# Patient Record
Sex: Female | Born: 1980 | Race: White | Hispanic: No | Marital: Married | Smoking: Never smoker
Health system: Southern US, Community
[De-identification: ages and names within clinical notes are randomized; demographics above are authoritative.]

## PROBLEM LIST (undated history)

## (undated) DIAGNOSIS — G43909 Migraine, unspecified, not intractable, without status migrainosus: Secondary | ICD-10-CM

## (undated) HISTORY — DX: Migraine, unspecified, not intractable, without status migrainosus: G43.909

## (undated) HISTORY — PX: WISDOM TOOTH EXTRACTION: SHX21

## (undated) HISTORY — PX: AUGMENTATION MAMMAPLASTY: SUR837

---

## 2009-06-14 LAB — ABO/RH: RH Type: POSITIVE

## 2011-02-04 ENCOUNTER — Ambulatory Visit (INDEPENDENT_AMBULATORY_CARE_PROVIDER_SITE_OTHER): Payer: BC Managed Care – PPO | Admitting: Obstetrics & Gynecology

## 2011-02-04 ENCOUNTER — Encounter: Payer: Self-pay | Admitting: Obstetrics & Gynecology

## 2011-02-04 VITALS — BP 111/68 | HR 71 | Temp 98.5°F | Resp 16 | Ht 66.0 in | Wt 172.0 lb

## 2011-02-04 DIAGNOSIS — Z30430 Encounter for insertion of intrauterine contraceptive device: Secondary | ICD-10-CM

## 2011-02-04 MED ORDER — LEVONORGESTREL 20 MCG/24HR IU IUD
INTRAUTERINE_SYSTEM | Freq: Once | INTRAUTERINE | Status: AC
  Administered 2011-02-04: 16:00:00 via INTRAUTERINE

## 2011-02-04 NOTE — Progress Notes (Signed)
  Subjective:    Patient ID: Carol Montoya, female    DOB: Oct 06, 1980, 30 y.o.   MRN: [id number]  HPI She has 3 children and wants a Mirena.  She is tired of taking the pill.    Review of Systems    pap normal and UTD, Flu shot already given. Objective:   Physical Exam  IUD Insertion Procedure Note  Pre-operative Diagnosis: desires LARC  Post-operative Diagnosis: same  Indications: contraception  Procedure Details  Urine pregnancy test was done and result was negative.  The risks (including infection, bleeding, pain, and uterine perforation) and benefits of the procedure were explained to the patient and Verbal informed consent was obtained.    Cervix cleansed with Betadine. Uterus sounded to 8 cm. IUD inserted without difficulty. String visible and trimmed . Patient tolerated procedure well.  IUD Information: Mirena.  Condition: Stable  Complications: None  Plan:  The patient was advised to call for any fever or for prolonged or severe pain or bleeding. She was advised to use NSAID as needed for mild to moderate pain.   Attending Physician Documentation: I was present for or participated in the entire procedure, including opening and closing.      Assessment & Plan:

## 2011-02-04 NOTE — Progress Notes (Signed)
Addended by: Granville Lewis on: 02/04/2011 04:29 PM   Modules accepted: Orders

## 2011-03-10 ENCOUNTER — Encounter: Payer: Self-pay | Admitting: *Deleted

## 2011-05-05 ENCOUNTER — Ambulatory Visit: Payer: BC Managed Care – PPO

## 2011-10-13 LAB — CBC AND DIFFERENTIAL
HCT: 42 % (ref 36–46)
Hemoglobin: 14.4 g/dL (ref 12.0–16.0)
Platelets: 230 10*3/uL (ref 150–399)
WBC: 7.7 10^3/mL

## 2011-10-13 LAB — BASIC METABOLIC PANEL
BUN: 17 mg/dL (ref 4–21)
Creatinine: 0.9 mg/dL (ref 0.5–1.1)
Glucose: 86 mg/dL
Potassium: 3.8 mmol/L (ref 3.4–5.3)
Sodium: 137 mmol/L (ref 137–147)

## 2011-10-13 LAB — HEPATIC FUNCTION PANEL
ALT: 8 U/L (ref 7–35)
AST: 10 U/L — AB (ref 13–35)
Bilirubin, Total: 0.5 mg/dL

## 2012-02-12 ENCOUNTER — Ambulatory Visit: Payer: BC Managed Care – PPO | Admitting: Obstetrics & Gynecology

## 2012-02-12 ENCOUNTER — Ambulatory Visit (INDEPENDENT_AMBULATORY_CARE_PROVIDER_SITE_OTHER): Payer: BC Managed Care – PPO | Admitting: Obstetrics & Gynecology

## 2012-02-12 ENCOUNTER — Encounter: Payer: Self-pay | Admitting: Obstetrics & Gynecology

## 2012-02-12 VITALS — BP 103/72 | HR 84 | Temp 97.2°F | Resp 16 | Ht 66.0 in | Wt 135.0 lb

## 2012-02-12 DIAGNOSIS — Z0000 Encounter for general adult medical examination without abnormal findings: Secondary | ICD-10-CM

## 2012-02-12 DIAGNOSIS — Z23 Encounter for immunization: Secondary | ICD-10-CM

## 2012-02-12 DIAGNOSIS — Z01419 Encounter for gynecological examination (general) (routine) without abnormal findings: Secondary | ICD-10-CM

## 2012-02-12 DIAGNOSIS — Z1151 Encounter for screening for human papillomavirus (HPV): Secondary | ICD-10-CM

## 2012-02-12 DIAGNOSIS — Z124 Encounter for screening for malignant neoplasm of cervix: Secondary | ICD-10-CM

## 2012-02-12 NOTE — Progress Notes (Signed)
Subjective:    Carol Montoya is a 31 y.o. female who presents for an annual exam. The patient has no complaints today. The patient is sexually active. GYN screening history: last pap: was normal. The patient wears seatbelts: yes. The patient participates in regular exercise: yes. Has the patient ever been transfused or tattooed?: no. The patient reports that there is not domestic violence in her life.   Menstrual History: OB History    Grav Para Term Preterm Abortions TAB SAB Ect Mult Living   3 3 3       3       Menarche age: 71 No LMP recorded. Patient is not currently having periods (Reason: IUD).    The following portions of the patient's history were reviewed and updated as appropriate: allergies, current medications, past family history, past medical history, past social history, past surgical history and problem list.  Review of Systems A comprehensive review of systems was negative. She has been with her husband since 40 yo.   Objective:    BP 103/72  Pulse 84  Temp 97.2 F (36.2 C) (Oral)  Resp 16  Ht 5\' 6"  (1.676 m)  Wt 135 lb (61.236 kg)  BMI 21.79 kg/m2  Breastfeeding? No  General Appearance:    Alert, cooperative, no distress, appears stated age  Head:    Normocephalic, without obvious abnormality, atraumatic  Eyes:    PERRL, conjunctiva/corneas clear, EOM's intact, fundi    benign, both eyes  Ears:    Normal TM's and external ear canals, both ears  Nose:   Nares normal, septum midline, mucosa normal, no drainage    or sinus tenderness  Throat:   Lips, mucosa, and tongue normal; teeth and gums normal  Neck:   Supple, symmetrical, trachea midline, no adenopathy;    thyroid:  no enlargement/tenderness/nodules; no carotid   bruit or JVD  Back:     Symmetric, no curvature, ROM normal, no CVA tenderness  Lungs:     Clear to auscultation bilaterally, respirations unlabored  Chest Wall:    No tenderness or deformity   Heart:    Regular rate and rhythm, S1 and S2  normal, no murmur, rub   or gallop  Breast Exam:    No tenderness, masses, or nipple abnormality  Abdomen:     Soft, non-tender, bowel sounds active all four quadrants,    no masses, no organomegaly  Genitalia:    Normal female without lesion, discharge or tenderness, IUD strings seen, NSSA, NT, normal adnexa     Extremities:   Extremities normal, atraumatic, no cyanosis or edema  Pulses:   2+ and symmetric all extremities  Skin:   Skin color, texture, turgor normal, no rashes or lesions  Lymph nodes:   Cervical, supraclavicular, and axillary nodes normal  Neurologic:   CNII-XII intact, normal strength, sensation and reflexes    throughout  .    Assessment:    Healthy female exam.    Plan:     Pap smear.

## 2013-03-09 ENCOUNTER — Ambulatory Visit: Payer: BC Managed Care – PPO | Admitting: Obstetrics & Gynecology

## 2013-03-16 ENCOUNTER — Encounter: Payer: Self-pay | Admitting: Obstetrics & Gynecology

## 2013-03-16 ENCOUNTER — Ambulatory Visit (INDEPENDENT_AMBULATORY_CARE_PROVIDER_SITE_OTHER): Payer: BC Managed Care – PPO | Admitting: Obstetrics & Gynecology

## 2013-03-16 VITALS — BP 102/64 | HR 69 | Resp 16 | Ht 65.0 in | Wt 147.0 lb

## 2013-03-16 DIAGNOSIS — Z23 Encounter for immunization: Secondary | ICD-10-CM

## 2013-03-16 DIAGNOSIS — Z803 Family history of malignant neoplasm of breast: Secondary | ICD-10-CM

## 2013-03-16 DIAGNOSIS — Z01419 Encounter for gynecological examination (general) (routine) without abnormal findings: Secondary | ICD-10-CM

## 2013-03-16 NOTE — Progress Notes (Signed)
  Subjective:     Carol Montoya is a 32 y.o. female here for a routine exam.  Current complaints: would like a primary care provider.  Happy with Mirnea---rare spotting.  Personal health questionnaire reviewed: yes.   Gynecologic History No LMP recorded. Patient is not currently having periods (Reason: IUD). Contraception: IUD Last Pap: 2013. Results were: normal with Neg co testing Last mammogram: n/a. Results were: n/a  Obstetric History OB History  Gravida Para Term Preterm AB SAB TAB Ectopic Multiple Living  3 3 3       3     # Outcome Date GA Lbr Len/2nd Weight Sex Delivery Anes PTL Lv  3 TRM 01/10/10    M SVD     2 TRM 02/19/06    M SVD     1 TRM 11/25/01    M SVD          The following portions of the patient's history were reviewed and updated as appropriate: allergies, current medications, past family history, past medical history, past social history, past surgical history and problem list.  Review of Systems Pertinent items are noted in HPI.    Objective:      Filed Vitals:   03/16/13 0848  BP: 102/64  Pulse: 69  Resp: 16  Height: 5\' 5"  (1.651 m)  Weight: 147 lb (66.679 kg)   Vitals:  WNL General appearance: alert, cooperative and no distress Head: Normocephalic, without obvious abnormality, atraumatic Eyes: negative Throat: lips, mucosa, and tongue normal; teeth and gums normal Lungs: clear to auscultation bilaterally Breasts: normal appearance, no masses or tenderness, No nipple retraction or dimpling, No nipple discharge or bleeding Heart: regular rate and rhythm Abdomen: soft, non-tender; bowel sounds normal; no masses,  no organomegaly Pelvic: cervix normal in appearance, external genitalia normal, no adnexal masses or tenderness, no bladder tenderness, no cervical motion tenderness, perianal skin: no external genital warts noted, IUD strings felt,2 urethra without abnormality or discharge, uterus normal size, shape, and consistency and vagina normal  without discharge Extremities: no edema, redness or tenderness in the calves or thighs Skin: no lesions or rash Lymph nodes: Axillary adenopathy: none         Assessment:    Healthy female exam.    Plan:    Education reviewed: self breast exams and skin cancer screening. Contraception: IUD. Follow up in: 1 year. Flu shot Referral to FP to establish primary care.

## 2013-03-16 NOTE — Addendum Note (Signed)
Addended by: Arne Cleveland on: 03/16/2013 09:28 AM   Modules accepted: Orders, SmartSet

## 2013-07-04 ENCOUNTER — Ambulatory Visit: Payer: BC Managed Care – PPO | Admitting: Physician Assistant

## 2013-07-11 ENCOUNTER — Encounter (INDEPENDENT_AMBULATORY_CARE_PROVIDER_SITE_OTHER): Payer: Self-pay

## 2013-07-11 ENCOUNTER — Encounter: Payer: Self-pay | Admitting: Physician Assistant

## 2013-07-11 ENCOUNTER — Ambulatory Visit (INDEPENDENT_AMBULATORY_CARE_PROVIDER_SITE_OTHER): Payer: BC Managed Care – PPO | Admitting: Physician Assistant

## 2013-07-11 VITALS — BP 95/54 | HR 73 | Ht 66.0 in | Wt 148.0 lb

## 2013-07-11 DIAGNOSIS — F3289 Other specified depressive episodes: Secondary | ICD-10-CM

## 2013-07-11 DIAGNOSIS — R5381 Other malaise: Secondary | ICD-10-CM

## 2013-07-11 DIAGNOSIS — F32A Depression, unspecified: Secondary | ICD-10-CM

## 2013-07-11 DIAGNOSIS — F329 Major depressive disorder, single episode, unspecified: Secondary | ICD-10-CM

## 2013-07-11 DIAGNOSIS — R5383 Other fatigue: Secondary | ICD-10-CM

## 2013-07-11 DIAGNOSIS — R6889 Other general symptoms and signs: Secondary | ICD-10-CM

## 2013-07-11 DIAGNOSIS — Z23 Encounter for immunization: Secondary | ICD-10-CM

## 2013-07-11 DIAGNOSIS — Z0000 Encounter for general adult medical examination without abnormal findings: Secondary | ICD-10-CM

## 2013-07-11 DIAGNOSIS — Z131 Encounter for screening for diabetes mellitus: Secondary | ICD-10-CM

## 2013-07-11 DIAGNOSIS — Z13220 Encounter for screening for lipoid disorders: Secondary | ICD-10-CM

## 2013-07-11 LAB — CBC
HEMATOCRIT: 38.9 % (ref 36.0–46.0)
HEMOGLOBIN: 13.3 g/dL (ref 12.0–15.0)
MCH: 31.7 pg (ref 26.0–34.0)
MCHC: 34.2 g/dL (ref 30.0–36.0)
MCV: 92.8 fL (ref 78.0–100.0)
Platelets: 272 10*3/uL (ref 150–400)
RBC: 4.19 MIL/uL (ref 3.87–5.11)
RDW: 13.7 % (ref 11.5–15.5)
WBC: 6.7 10*3/uL (ref 4.0–10.5)

## 2013-07-11 NOTE — Patient Instructions (Signed)
Please call so we can fax lab to psychiatrist.

## 2013-07-11 NOTE — Progress Notes (Signed)
Subjective:    Patient ID: Carol Montoya, female    DOB: 1980-09-28, 33 y.o.   MRN: [id number]  HPI Pt is a 33 yo female who presents to the clinic to establish care and get screening labs. She does admit to being cold all the time. Denies any hair or skin changes. She is also always tired. She has hx of depression but controlled with topamax. Her psychiatrist tried everything else before and this is the only thing that worked. She denies any depression today. She exercises regularly at least 3-5 times a week.   . Active Ambulatory Problems    Diagnosis Date Noted  . Family history of breast cancer 03/16/2013  . Depression 07/11/2013   Resolved Ambulatory Problems    Diagnosis Date Noted  . No Resolved Ambulatory Problems   Past Medical History  Diagnosis Date  . Migraines    . History   Social History  . Marital Status: Single    Spouse Name: N/A    Number of Children: N/A  . Years of Education: N/A   Occupational History  . Insurance agent    Social History Main Topics  . Smoking status: Never Smoker   . Smokeless tobacco: Never Used  . Alcohol Use: Yes     Comment: once monthly  . Drug Use: No  . Sexual Activity: Yes    Partners: Male   Other Topics Concern  . Not on file   Social History Narrative  . No narrative on file   . Family History  Problem Relation Age of Onset  . Depression Mother   . Cancer Maternal Aunt      Review of Systems  All other systems reviewed and are negative.       Objective:   Physical Exam  Constitutional: She is oriented to person, place, and time. She appears well-developed and well-nourished.  HENT:  Head: Normocephalic and atraumatic.  Right Ear: External ear normal.  Left Ear: External ear normal.  Nose: Nose normal.  Mouth/Throat: Oropharynx is clear and moist. No oropharyngeal exudate.  Right ear impacted with cerumen.  TM of left clear.   Eyes: Conjunctivae and EOM are normal. Pupils are equal, round, and  reactive to light. Right eye exhibits no discharge. Left eye exhibits no discharge.  Neck: Normal range of motion. Neck supple. No JVD present. No tracheal deviation present. No thyromegaly present.  Cardiovascular: Normal rate, regular rhythm and normal heart sounds.   Pulmonary/Chest: Effort normal and breath sounds normal. She has no wheezes.  Abdominal: Soft. Bowel sounds are normal. She exhibits no distension and no mass. There is no tenderness. There is no rebound and no guarding.  Musculoskeletal: Normal range of motion.  Lymphadenopathy:    She has no cervical adenopathy.  Neurological: She is alert and oriented to person, place, and time. She has normal reflexes. No cranial nerve deficit. Coordination normal.  Skin: Skin is dry.  Psychiatric: She has a normal mood and affect. Her behavior is normal.          Assessment & Plan:  Depression- managed by Jerene Dilling psychiatrist. PHQ-9 was 1.   Fatigue/cold intolerance- will get faitgue work up. Continue with exercise.   Right ear cerumen impaction- forgot to get nurse to irrigate. Will call pt and encourage debrox if not able to clean out come in for irrigation.    Routine CPE-  Will get screening labs.  Tdap was given without complication.  Pap smear 2014, normal and  up to date.  Continue to exercise.  Weight looks great.  recommended calcium 1200mg  and vitamin D 800IU. Follow up as needed.

## 2013-07-12 LAB — VITAMIN D 25 HYDROXY (VIT D DEFICIENCY, FRACTURES): Vit D, 25-Hydroxy: 33 ng/mL (ref 30–89)

## 2013-07-12 LAB — COMPLETE METABOLIC PANEL WITH GFR
ALT: 10 U/L (ref 0–35)
AST: 12 U/L (ref 0–37)
Albumin: 4.6 g/dL (ref 3.5–5.2)
Alkaline Phosphatase: 45 U/L (ref 39–117)
BUN: 16 mg/dL (ref 6–23)
CO2: 21 mEq/L (ref 19–32)
Calcium: 9 mg/dL (ref 8.4–10.5)
Chloride: 109 mEq/L (ref 96–112)
Creat: 0.88 mg/dL (ref 0.50–1.10)
GFR, Est African American: >89 mL/min
GFR, Est Non African American: 85 mL/min
Glucose, Bld: 89 mg/dL (ref 70–99)
Potassium: 4.3 mEq/L (ref 3.5–5.3)
Sodium: 140 mEq/L (ref 135–145)
Total Bilirubin: 0.4 mg/dL (ref 0.2–1.2)
Total Protein: 6.9 g/dL (ref 6.0–8.3)

## 2013-07-12 LAB — LIPID PANEL
CHOLESTEROL: 117 mg/dL (ref 0–200)
HDL: 45 mg/dL (ref >39)
LDL Cholesterol: 60 mg/dL (ref 0–99)
TRIGLYCERIDES: 58 mg/dL (ref <150)
Total CHOL/HDL Ratio: 2.6 Ratio
VLDL: 12 mg/dL (ref 0–40)

## 2013-07-12 LAB — FERRITIN: FERRITIN: 54 ng/mL (ref 10–291)

## 2013-07-12 LAB — T4, FREE: Free T4: 0.8 ng/dL (ref 0.80–1.80)

## 2013-07-12 LAB — VITAMIN B12: Vitamin B-12: 300 pg/mL (ref 211–911)

## 2013-07-12 LAB — TSH: TSH: 1.837 u[IU]/mL (ref 0.350–4.500)

## 2013-07-12 LAB — T3, FREE: T3, Free: 3.2 pg/mL (ref 2.3–4.2)

## 2013-12-21 ENCOUNTER — Encounter: Payer: Self-pay | Admitting: Physician Assistant

## 2014-02-20 ENCOUNTER — Encounter: Payer: Self-pay | Admitting: Physician Assistant

## 2014-03-22 ENCOUNTER — Encounter: Payer: Self-pay | Admitting: Obstetrics & Gynecology

## 2014-03-22 ENCOUNTER — Ambulatory Visit (INDEPENDENT_AMBULATORY_CARE_PROVIDER_SITE_OTHER): Payer: BC Managed Care – PPO | Admitting: Obstetrics & Gynecology

## 2014-03-22 VITALS — BP 101/61 | HR 63 | Resp 16 | Ht 66.0 in | Wt 144.0 lb

## 2014-03-22 DIAGNOSIS — Z124 Encounter for screening for malignant neoplasm of cervix: Secondary | ICD-10-CM

## 2014-03-22 DIAGNOSIS — Z23 Encounter for immunization: Secondary | ICD-10-CM

## 2014-03-22 DIAGNOSIS — Z113 Encounter for screening for infections with a predominantly sexual mode of transmission: Secondary | ICD-10-CM | POA: Diagnosis not present

## 2014-03-22 DIAGNOSIS — Z0000 Encounter for general adult medical examination without abnormal findings: Secondary | ICD-10-CM

## 2014-03-22 DIAGNOSIS — Z1151 Encounter for screening for human papillomavirus (HPV): Secondary | ICD-10-CM

## 2014-03-22 DIAGNOSIS — Z01419 Encounter for gynecological examination (general) (routine) without abnormal findings: Secondary | ICD-10-CM | POA: Diagnosis not present

## 2014-03-22 LAB — RPR

## 2014-03-22 MED ORDER — INFLUENZA VAC SPLIT QUAD 0.5 ML IM SUSY
0.5000 mL | PREFILLED_SYRINGE | Freq: Once | INTRAMUSCULAR | Status: AC
  Administered 2014-03-22: 0.5 mL via INTRAMUSCULAR

## 2014-03-22 NOTE — Progress Notes (Signed)
Subjective:    Carol Montoya is a 33 y.o. separated W P3 (12, 8, 514 yo sons) female who presents for an annual exam. The patient has no complaints today except always freezing and tired. She had TSH checked recently. She saw Norval GableJade Breebeck recently.The patient is sexually active. GYN screening history: last pap: was normal. The patient wears seatbelts: yes. The patient participates in regular exercise: yes. Has the patient ever been transfused or tattooed?: yes. (transfusion after first delivery) The patient reports that there is not domestic violence in her life.   Menstrual History: OB History    Gravida Para Term Preterm AB TAB SAB Ectopic Multiple Living   3 3 3       3       Menarche age: [age] No LMP recorded. Patient is not currently having periods (Reason: IUD).    The following portions of the patient's history were reviewed and updated as appropriate: allergies, current medications, past family history, past medical history, past social history, past surgical history and problem list.  Review of Systems Pertinent items are noted in HPI. 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 Monogamous with boyfriend for 1+ year. Denies dyspareunia. Happy with amenorrhea due to Mirena, due to be replaced 10/17. Wants flu vaccine.    Objective:    BP 101/61 mmHg  Pulse 63  Resp 16  Ht 5\' 6"  (1.676 m)  Wt 144 lb (65.318 kg)  BMI 23.25 kg/m2  General Appearance:    Alert, cooperative, no distress, appears stated age  Head:    Normocephalic, without obvious abnormality, atraumatic  Eyes:    PERRL, conjunctiva/corneas clear, EOM's intact, fundi    benign, both eyes  Ears:    Normal TM's and external ear canals, both ears  Nose:   Nares normal, septum midline, mucosa normal, no drainage    or sinus tenderness  Throat:   Lips, mucosa, and tongue normal; teeth and gums normal  Neck:   Supple, symmetrical, trachea midline, no adenopathy;    thyroid:  no enlargement/tenderness/nodules; no  carotid   bruit or JVD  Back:     Symmetric, no curvature, ROM normal, no CVA tenderness  Lungs:     Clear to auscultation bilaterally, respirations unlabored  Chest Wall:    No tenderness or deformity   Heart:    Regular rate and rhythm, S1 and S2 normal, no murmur, rub   or gallop  Breast Exam:    No tenderness, masses, or nipple abnormality  Abdomen:     Soft, non-tender, bowel sounds active all four quadrants,    no masses, no organomegaly  Genitalia:    Normal female without lesion, discharge or tenderness     Extremities:   Extremities normal, atraumatic, no cyanosis or edema  Pulses:   2+ and symmetric all extremities  Skin:   Skin color, texture, turgor normal, no rashes or lesions  Lymph nodes:   Cervical, supraclavicular, and axillary nodes normal  Neurologic:   CNII-XII intact, normal strength, sensation and reflexes    throughout  .    Assessment:    Healthy female exam.    Plan:     Breast self exam technique reviewed and patient encouraged to perform self-exam monthly. Thin prep Pap smear. with cotesting, gc, ct testing STI testing

## 2014-03-23 LAB — HEPATITIS C ANTIBODY: HCV Ab: NEGATIVE

## 2014-03-23 LAB — HIV ANTIBODY (ROUTINE TESTING W REFLEX): HIV 1&2 Ab, 4th Generation: NONREACTIVE

## 2014-03-23 LAB — HEPATITIS B SURFACE ANTIGEN: Hepatitis B Surface Ag: NEGATIVE

## 2014-03-27 LAB — CYTOLOGY - PAP

## 2014-08-07 ENCOUNTER — Telehealth: Payer: Self-pay

## 2014-08-07 ENCOUNTER — Encounter: Payer: Self-pay | Admitting: Physician Assistant

## 2014-08-07 ENCOUNTER — Other Ambulatory Visit: Payer: Self-pay | Admitting: Physician Assistant

## 2014-08-07 ENCOUNTER — Ambulatory Visit (INDEPENDENT_AMBULATORY_CARE_PROVIDER_SITE_OTHER): Payer: BLUE CROSS/BLUE SHIELD | Admitting: Physician Assistant

## 2014-08-07 VITALS — BP 103/65 | HR 71 | Ht 66.0 in | Wt 147.0 lb

## 2014-08-07 DIAGNOSIS — F411 Generalized anxiety disorder: Secondary | ICD-10-CM | POA: Insufficient documentation

## 2014-08-07 DIAGNOSIS — Z79899 Other long term (current) drug therapy: Secondary | ICD-10-CM | POA: Diagnosis not present

## 2014-08-07 DIAGNOSIS — F329 Major depressive disorder, single episode, unspecified: Secondary | ICD-10-CM

## 2014-08-07 DIAGNOSIS — Z131 Encounter for screening for diabetes mellitus: Secondary | ICD-10-CM | POA: Diagnosis not present

## 2014-08-07 DIAGNOSIS — R5383 Other fatigue: Secondary | ICD-10-CM

## 2014-08-07 DIAGNOSIS — F32A Depression, unspecified: Secondary | ICD-10-CM

## 2014-08-07 LAB — CBC WITH DIFFERENTIAL/PLATELET
Basophils Absolute: 0.1 10*3/uL (ref 0.0–0.1)
Basophils Relative: 1 % (ref 0–1)
Eosinophils Absolute: 0.2 10*3/uL (ref 0.0–0.7)
Eosinophils Relative: 3 % (ref 0–5)
HCT: 41.1 % (ref 36.0–46.0)
Hemoglobin: 13.5 g/dL (ref 12.0–15.0)
Lymphocytes Relative: 21 % (ref 12–46)
Lymphs Abs: 1.2 10*3/uL (ref 0.7–4.0)
MCH: 30.6 pg (ref 26.0–34.0)
MCHC: 32.8 g/dL (ref 30.0–36.0)
MCV: 93.2 fL (ref 78.0–100.0)
MPV: 9.9 fL (ref 8.6–12.4)
Monocytes Absolute: 0.3 10*3/uL (ref 0.1–1.0)
Monocytes Relative: 6 % (ref 3–12)
Neutro Abs: 3.9 10*3/uL (ref 1.7–7.7)
Neutrophils Relative %: 69 % (ref 43–77)
Platelets: 267 10*3/uL (ref 150–400)
RBC: 4.41 MIL/uL (ref 3.87–5.11)
RDW: 13.4 % (ref 11.5–15.5)
WBC: 5.6 10*3/uL (ref 4.0–10.5)

## 2014-08-07 LAB — COMPLETE METABOLIC PANEL WITH GFR
ALT: 9 U/L (ref 0–35)
AST: 12 U/L (ref 0–37)
Albumin: 4.3 g/dL (ref 3.5–5.2)
Alkaline Phosphatase: 47 U/L (ref 39–117)
BUN: 22 mg/dL (ref 6–23)
CALCIUM: 9 mg/dL (ref 8.4–10.5)
CHLORIDE: 108 meq/L (ref 96–112)
CO2: 24 mEq/L (ref 19–32)
CREATININE: 0.78 mg/dL (ref 0.50–1.10)
GLUCOSE: 78 mg/dL (ref 70–99)
Potassium: 4.1 mEq/L (ref 3.5–5.3)
Sodium: 141 mEq/L (ref 135–145)
Total Bilirubin: 0.6 mg/dL (ref 0.2–1.2)
Total Protein: 6.9 g/dL (ref 6.0–8.3)

## 2014-08-07 MED ORDER — EPINEPHRINE 0.3 MG/0.3ML IJ SOAJ: 0.3000 mg | Freq: Once | INTRAMUSCULAR | Status: DC

## 2014-08-07 MED ORDER — BUSPIRONE HCL 5 MG PO TABS: 5.0000 mg | ORAL_TABLET | Freq: Three times a day (TID) | ORAL | Status: DC

## 2014-08-07 NOTE — Telephone Encounter (Signed)
Patient called stated that she forgot to request a rx for epi pens to have on hand for her shell fish allergy. Patient request it be sent to her pharmacy. Artelia Game,CMA

## 2014-08-07 NOTE — Patient Instructions (Signed)

## 2014-08-07 NOTE — Progress Notes (Signed)
   Subjective:    Patient ID: Carol Montoya, female    DOB: 08/14/1980, 34 y.o.   MRN: [id number]  HPI  Pt is a 34 yo female who has a ongoing battle with fatigue. She just feels tired all the time. She has had labs drawn in the past and wanted more drawn today. She admits to some anxiety and depression but medication never seems to help. She exercises 3 times a week. treied wellbutrin, lexapro, celexa and seroquel to help her sleep better. Pt feels like she sleeps fairly well not. No snoring or frequent waking.  Nothing seems to make better. On vitamin D.   Review of Systems  All other systems reviewed and are negative.      Objective:   Physical Exam  Constitutional: She is oriented to person, place, and time. She appears well-developed and well-nourished.  HENT:  Head: Normocephalic and atraumatic.  Neck: Normal range of motion. Neck supple. No thyromegaly present.  Cardiovascular: Normal rate, regular rhythm and normal heart sounds.   Pulmonary/Chest: Effort normal and breath sounds normal.  Lymphadenopathy:    She has no cervical adenopathy.  Neurological: She is alert and oriented to person, place, and time.  Psychiatric: She has a normal mood and affect. Her behavior is normal.          Assessment & Plan:  Anxiety/depression- PHQ-9 was 4. GAD-7 was 10. Discussed medication options declined everything except buspar. buspar up to 3 times a day given.   fagitue- will run labs again. Given fatigue HO. Pt is not obese. Sleeping fairly well. At this point sounds like pt has some chronic fatigue syndrome.

## 2014-08-07 NOTE — Telephone Encounter (Signed)
Sent!

## 2014-08-08 LAB — FERRITIN: Ferritin: 48 ng/mL (ref 10–291)

## 2014-08-08 LAB — VITAMIN D 25 HYDROXY (VIT D DEFICIENCY, FRACTURES): VIT D 25 HYDROXY: 25 ng/mL — AB (ref 30–100)

## 2014-08-08 LAB — VITAMIN B12: Vitamin B-12: 407 pg/mL (ref 211–911)

## 2014-08-08 LAB — TSH: TSH: 2.416 u[IU]/mL (ref 0.350–4.500)

## 2014-09-19 ENCOUNTER — Other Ambulatory Visit: Payer: Self-pay | Admitting: Physician Assistant

## 2014-12-08 ENCOUNTER — Other Ambulatory Visit: Payer: Self-pay | Admitting: Family Medicine

## 2015-01-23 ENCOUNTER — Other Ambulatory Visit: Payer: Self-pay | Admitting: Obstetrics & Gynecology

## 2015-01-23 DIAGNOSIS — Z1231 Encounter for screening mammogram for malignant neoplasm of breast: Secondary | ICD-10-CM

## 2015-02-06 ENCOUNTER — Ambulatory Visit (INDEPENDENT_AMBULATORY_CARE_PROVIDER_SITE_OTHER): Payer: BLUE CROSS/BLUE SHIELD | Admitting: Obstetrics & Gynecology

## 2015-02-06 ENCOUNTER — Encounter: Payer: Self-pay | Admitting: Obstetrics & Gynecology

## 2015-02-06 VITALS — BP 90/53 | HR 71 | Resp 16 | Ht 65.0 in | Wt 146.0 lb

## 2015-02-06 DIAGNOSIS — Z803 Family history of malignant neoplasm of breast: Secondary | ICD-10-CM

## 2015-02-06 DIAGNOSIS — Z30431 Encounter for routine checking of intrauterine contraceptive device: Secondary | ICD-10-CM

## 2015-02-06 NOTE — Progress Notes (Signed)
   Subjective:    Patient ID: Carol Montoya, female    DOB: 24-May-1980, 34 y.o.   MRN: [id number]  HPI  Patient is a 34 year old G3 P3 female who presents for breast exam and to have her IUD strings checked. Her mother was just diagnosed with stage III breast cancer a few weeks ago. She is undergoing chemotherapy. She will then undergo.bilateral mastectomy. Patient called her insurance company and they will pay for her mammogram. Patient is up-to-date on her Pap smear. She has only occasional spotting. She has no complaints  Review of Systems  Constitutional: Negative.   Respiratory: Negative.   Cardiovascular: Negative.   Gastrointestinal: Negative.   Genitourinary: Negative.        Objective:   Physical Exam  Constitutional: She is oriented to person, place, and time. She appears well-developed and well-nourished. No distress.  HENT:  Head: Normocephalic and atraumatic.  Eyes: Conjunctivae are normal.  Pulmonary/Chest: Effort normal.  Abdominal: Soft. Bowel sounds are normal. She exhibits no distension and no mass. There is no tenderness. There is no rebound and no guarding.  Genitourinary:  External genitalia is Tanner 5 Vagina normal rugae Cervix is close nontender with IUD strings seen Uterus anteverted nontender Adnexa is negative for mass or pain  Musculoskeletal: She exhibits no edema.  Neurological: She is alert and oriented to person, place, and time.  Skin: Skin is warm and dry.  Psychiatric: She has a normal mood and affect.  Vitals reviewed.         Assessment & Plan:  34 year old female presents for breast exam and requesting BRCA testing due to her mother and grandmother having breast cancer. There is no other familial cancers other than a aunt which she does not know the primary.  1-normal breast exam today. Patient requests mammogram insurance paid 2-my risk testing today 3-Pap smear up-to-date. Due in 2 years.

## 2015-02-08 ENCOUNTER — Ambulatory Visit (INDEPENDENT_AMBULATORY_CARE_PROVIDER_SITE_OTHER): Payer: BLUE CROSS/BLUE SHIELD

## 2015-02-08 DIAGNOSIS — Z1231 Encounter for screening mammogram for malignant neoplasm of breast: Secondary | ICD-10-CM

## 2015-02-10 ENCOUNTER — Other Ambulatory Visit: Payer: Self-pay | Admitting: Family Medicine

## 2015-03-22 ENCOUNTER — Encounter: Payer: Self-pay | Admitting: *Deleted

## 2015-04-02 ENCOUNTER — Telehealth: Payer: Self-pay | Admitting: *Deleted

## 2015-04-02 NOTE — Telephone Encounter (Signed)
-----   Message from Lesly DukesKelly H Leggett, MD sent at 03/30/2015 11:44 AM EST ----- My risk is negative.  RN to call pt.

## 2015-04-02 NOTE — Telephone Encounter (Signed)
Copy of neg My Risk results mailed to pt's home address.

## 2015-04-30 ENCOUNTER — Other Ambulatory Visit: Payer: Self-pay | Admitting: Physician Assistant

## 2016-01-30 ENCOUNTER — Encounter: Payer: Self-pay | Admitting: Obstetrics & Gynecology

## 2016-01-30 ENCOUNTER — Ambulatory Visit (INDEPENDENT_AMBULATORY_CARE_PROVIDER_SITE_OTHER): Payer: Medicaid Other | Admitting: Obstetrics & Gynecology

## 2016-01-30 VITALS — BP 110/71 | HR 70 | Resp 16 | Ht 66.0 in | Wt 157.0 lb

## 2016-01-30 DIAGNOSIS — Z30433 Encounter for removal and reinsertion of intrauterine contraceptive device: Secondary | ICD-10-CM | POA: Diagnosis not present

## 2016-01-30 DIAGNOSIS — Z23 Encounter for immunization: Secondary | ICD-10-CM

## 2016-01-30 DIAGNOSIS — Z975 Presence of (intrauterine) contraceptive device: Secondary | ICD-10-CM

## 2016-01-30 DIAGNOSIS — Z0000 Encounter for general adult medical examination without abnormal findings: Secondary | ICD-10-CM

## 2016-01-30 MED ORDER — TOPIRAMATE 100 MG PO TABS: 100.0000 mg | ORAL_TABLET | Freq: Two times a day (BID) | ORAL | 6 refills | Status: DC

## 2016-01-30 NOTE — Progress Notes (Signed)
   Subjective:    Patient ID: Carol Montoya, female    DOB: 07/18/1980, 35 y.o.   MRN: [id number]  HPI  35 yo SW G3 (14, 959 and 37 yo kids) here to have her 89 yo Mirena replaced with a new one. She is amenorrheic.  Review of Systems     Objective:   Physical Exam [id number] Breathing, conversing, and ambulating normally Consent signed, Time out procedure done. Cervix prepped with betadine and grasped with a single tooth tenaculum. I had to use an IUD hook to find the Mirena strings, I then easily removed the Mirena and noted that it was intact. New Mirena was easily placed and the strings were cut to 3-4 cm. Uterus sounded to 9 cm. She tolerated the procedure well.      Assessment & Plan:  Contraception- Mirena RTC 1 year for annual Refill of topamax for migraines Flu vaccine today

## 2016-07-14 ENCOUNTER — Encounter: Payer: Self-pay | Admitting: Obstetrics & Gynecology

## 2016-07-14 ENCOUNTER — Other Ambulatory Visit (HOSPITAL_COMMUNITY)
Admission: RE | Admit: 2016-07-14 | Discharge: 2016-07-14 | Disposition: A | Discharge status: Home / Self Care | Payer: Medicaid Other | Source: Ambulatory Visit | Attending: Obstetrics & Gynecology | Admitting: Obstetrics & Gynecology

## 2016-07-14 ENCOUNTER — Ambulatory Visit (INDEPENDENT_AMBULATORY_CARE_PROVIDER_SITE_OTHER): Payer: Medicaid Other | Admitting: Obstetrics & Gynecology

## 2016-07-14 VITALS — BP 93/58 | HR 67 | Resp 16 | Ht 66.0 in | Wt 163.0 lb

## 2016-07-14 DIAGNOSIS — Z01419 Encounter for gynecological examination (general) (routine) without abnormal findings: Secondary | ICD-10-CM | POA: Insufficient documentation

## 2016-07-14 MED ORDER — VITAMIN D (ERGOCALCIFEROL) 1.25 MG (50000 UNIT) PO CAPS: 50000.0000 [IU] | ORAL_CAPSULE | ORAL | 3 refills | Status: DC

## 2016-07-14 NOTE — Progress Notes (Signed)
Subjective:    Carol Montoya is a 36 y.o. SW P3 female who presents for an annual exam. She feels like her anxiety is becoming problematic with her life. Would be interested in see someone for this. She has less libido than she would like.  The patient is sexually active. GYN screening history: last pap: was normal. The patient wears seatbelts: yes. The patient participates in regular exercise: yes. Has the patient ever been transfused or tattooed?: no. The patient reports that there is not domestic violence in her life.   Menstrual History: OB History    Gravida Para Term Preterm AB Living   _0 SAB TAB Ectopic Multiple Live Births                  Menarche age: 61 No LMP recorded. Patient is not currently having periods (Reason: IUD).    The following portions of the patient's history were reviewed and updated as appropriate: allergies, current medications, past family history, past medical history, past social history, past surgical history and problem list.  Review of Systems Pertinent items are noted in HPI.   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   Objective:    BP (!) 93/58   Pulse 67   Resp 16   Ht _1  (1.676 m)   Wt 163 lb (73.9 kg)   BMI 26.31 kg/m   General Appearance:    Alert, cooperative, no distress, appears stated age  Head:    Normocephalic, without obvious abnormality, atraumatic  Eyes:    PERRL, conjunctiva/corneas clear, EOM's intact, fundi    benign, both eyes  Ears:    Normal TM's and external ear canals, both ears  Nose:   Nares normal, septum midline, mucosa normal, no drainage    or sinus tenderness  Throat:   Lips, mucosa, and tongue normal; teeth and gums normal  Neck:   Supple, symmetrical, trachea  midline, no adenopathy;    thyroid:  no enlargement/tenderness/nodules; no carotid   bruit or JVD  Back:     Symmetric, no curvature, ROM normal, no CVA tenderness  Lungs:     Clear to auscultation bilaterally, respirations unlabored  Chest Wall:    No tenderness or deformity   Heart:    Regular rate and rhythm, S1 and S2 normal, no murmur, rub   or gallop  Breast Exam:    No tenderness, masses, or nipple abnormality  Abdomen:     Soft, non-tender, bowel sounds active all four quadrants,    no masses, no organomegaly  Genitalia:    Normal female without lesion, discharge or tenderness  Rectal:    Normal tone, normal prostate, no masses or tenderness;   guaiac negative stool  Extremities:   Extremities normal, atraumatic, no cyanosis or edema  Pulses:   2+ and symmetric all extremities  Skin:   Skin color, texture, turgor normal, no rashes or lesions  Lymph nodes:   Cervical, supraclavicular, and axillary nodes normal  Neurologic:   CNII-XII intact, normal strength, sensation and reflexes    throughout  .    Assessment:    Healthy female exam.    Plan:     Thin prep  Pap smear. with cotesting Ref to psychiatry

## 2016-07-16 LAB — CYTOLOGY - PAP
Diagnosis: NEGATIVE
HPV (WINDOPATH): NOT DETECTED

## 2017-02-24 ENCOUNTER — Other Ambulatory Visit: Payer: Self-pay | Admitting: *Deleted

## 2017-02-24 MED ORDER — TOPIRAMATE 100 MG PO TABS
100.0000 mg | ORAL_TABLET | Freq: Two times a day (BID) | ORAL | 6 refills | Status: DC
Start: 2017-02-24 — End: 2017-03-03

## 2017-02-24 NOTE — Telephone Encounter (Signed)
RF request received for Topamx 100 mg.  Per VO of Dr Marice Potterove OK to RF until her next appt.

## 2017-03-03 ENCOUNTER — Other Ambulatory Visit: Payer: Self-pay | Admitting: *Deleted

## 2017-03-03 MED ORDER — TOPIRAMATE 100 MG PO TABS: 100.0000 mg | ORAL_TABLET | Freq: Two times a day (BID) | ORAL | 6 refills | Status: DC

## 2017-03-03 NOTE — Telephone Encounter (Signed)
Pt called stating that [facility] called last week about a RF on her Topamax.  The RX was approved but sent to the wrong [facility] on [address].  It was resent this AM to [address] [address].

## 2017-03-17 DIAGNOSIS — M5126 Other intervertebral disc displacement, lumbar region: Secondary | ICD-10-CM | POA: Insufficient documentation

## 2017-06-04 DIAGNOSIS — D649 Anemia, unspecified: Secondary | ICD-10-CM | POA: Insufficient documentation

## 2018-01-06 ENCOUNTER — Encounter: Payer: Self-pay | Admitting: Obstetrics & Gynecology

## 2018-01-06 ENCOUNTER — Ambulatory Visit (INDEPENDENT_AMBULATORY_CARE_PROVIDER_SITE_OTHER): Payer: BLUE CROSS/BLUE SHIELD | Admitting: Obstetrics & Gynecology

## 2018-01-06 VITALS — BP 92/60 | HR 66 | Resp 16 | Ht 66.0 in | Wt 176.0 lb

## 2018-01-06 DIAGNOSIS — Z23 Encounter for immunization: Secondary | ICD-10-CM | POA: Diagnosis not present

## 2018-01-06 DIAGNOSIS — Z803 Family history of malignant neoplasm of breast: Secondary | ICD-10-CM

## 2018-01-06 DIAGNOSIS — Z01419 Encounter for gynecological examination (general) (routine) without abnormal findings: Secondary | ICD-10-CM | POA: Diagnosis not present

## 2018-01-06 DIAGNOSIS — Z1231 Encounter for screening mammogram for malignant neoplasm of breast: Secondary | ICD-10-CM

## 2018-01-06 NOTE — Progress Notes (Signed)
Subjective:     Carol EllisJoan K Montoya is a 37 y.o. female here for a routine exam.  Current complaints: wants to make IUD strings are present, increased frequency of migraines, wants maximum screening and prevention of breast cancer.    Gynecologic History No LMP recorded. (Menstrual status: IUD). Contraception: IUD Last Pap: 2017. Results were: normal Last mammogram: 2016. Results were: normal  Obstetric History OB History  Gravida Para Term Preterm AB Living  3 3 3     3   SAB TAB Ectopic Multiple Live Births               # Outcome Date GA Lbr Len/2nd Weight Sex Delivery Anes PTL Lv  3 Term 01/10/10    M Vag-Spont     2 Term 02/19/06    M Vag-Spont     1 Term 11/25/01    M Vag-Spont        The following portions of the patient's history were reviewed and updated as appropriate: allergies, current medications, past family history, past medical history, past social history, past surgical history and problem list.  Review of Systems Pertinent items noted in HPI and remainder of comprehensive ROS otherwise negative.    Objective:   Vitals:   01/06/18 1131  BP: 92/60  Pulse: 66  Resp: 16  Weight: 176 lb (79.8 kg)  Height: 5\' 6"  (1.676 m)   Vitals:  WNL General appearance: alert, cooperative and no distress  HEENT: Normocephalic, without obvious abnormality, atraumatic Eyes: negative Throat: lips, mucosa, and tongue normal; teeth and gums normal  Respiratory: Clear to auscultation bilaterally  CV: Regular rate and rhythm  Breasts:  Normal appearance, no masses or tenderness, no nipple retraction or dimpling  GI: Soft, non-tender; bowel sounds normal; no masses,  no organomegaly  GU: External Genitalia:  Tanner V, no lesion Urethra:  No prolapse   Vagina: Pink, normal rugae, no blood or discharge  Cervix: No CMT, no lesion, IUD strings present  Uterus:  Normal size and contour, non tender  Adnexa: Normal, no masses, non tender  Musculoskeletal: No edema, redness or tenderness  in the calves or thighs  Skin: No lesions or rash  Lymphatic: Axillary adenopathy: none     Psychiatric: Normal mood and behavior    Assessment:    Healthy female exam.   Family History of Br Ca   Plan:     1. Flu vaccine need - Flu Vaccine QUAD 36+ mos IM (Fluarix, Quad PF)  2. Family history of breast cancer - MM DIGITAL SCREENING BILATERAL; Future - MR BREAST BILATERAL WO CONTRAST; Future - Digital Screening Bilateral; Future - Referral to Dr. Dwain SarnaWakefield for consult--strong family history with negative genetic testing.   3.  Referral to [facility] for migraine consult and possible Emgalty start.

## 2018-02-12 ENCOUNTER — Institutional Professional Consult (permissible substitution): Payer: BLUE CROSS/BLUE SHIELD | Admitting: Physician Assistant

## 2018-02-26 ENCOUNTER — Institutional Professional Consult (permissible substitution): Payer: BLUE CROSS/BLUE SHIELD | Admitting: Physician Assistant

## 2018-03-29 ENCOUNTER — Telehealth: Payer: Self-pay

## 2018-03-29 MED ORDER — TOPIRAMATE 100 MG PO TABS: 100.0000 mg | ORAL_TABLET | Freq: Two times a day (BID) | ORAL | 6 refills | Status: DC

## 2018-03-29 NOTE — Telephone Encounter (Signed)
PT called wanting refill of Topamax. Pt was seen for annual Sept 2019. Mariel AloeLora Clark, RN said it was okay to refill. Refill sent.

## 2018-04-28 ENCOUNTER — Ambulatory Visit: Payer: BLUE CROSS/BLUE SHIELD

## 2018-05-05 ENCOUNTER — Ambulatory Visit (INDEPENDENT_AMBULATORY_CARE_PROVIDER_SITE_OTHER): Payer: BLUE CROSS/BLUE SHIELD

## 2018-05-05 DIAGNOSIS — Z1239 Encounter for other screening for malignant neoplasm of breast: Secondary | ICD-10-CM

## 2018-05-05 DIAGNOSIS — Z803 Family history of malignant neoplasm of breast: Secondary | ICD-10-CM

## 2018-05-06 ENCOUNTER — Other Ambulatory Visit: Payer: Self-pay | Admitting: Obstetrics & Gynecology

## 2018-05-06 DIAGNOSIS — R928 Other abnormal and inconclusive findings on diagnostic imaging of breast: Secondary | ICD-10-CM

## 2018-05-10 ENCOUNTER — Ambulatory Visit
Admission: RE | Admit: 2018-05-10 | Discharge: 2018-05-10 | Disposition: A | Discharge status: Home / Self Care | Payer: BLUE CROSS/BLUE SHIELD | Source: Ambulatory Visit | Attending: Obstetrics & Gynecology | Admitting: Obstetrics & Gynecology

## 2018-05-10 ENCOUNTER — Ambulatory Visit: Admission: RE | Admit: 2018-05-10 | Payer: BLUE CROSS/BLUE SHIELD | Source: Ambulatory Visit

## 2018-05-10 DIAGNOSIS — R928 Other abnormal and inconclusive findings on diagnostic imaging of breast: Secondary | ICD-10-CM

## 2018-10-27 ENCOUNTER — Telehealth: Payer: Self-pay | Admitting: *Deleted

## 2018-10-28 NOTE — Telephone Encounter (Signed)
Pt had Breast U/S in Jan 2020 and is now inquiring when she should have her breast MRI.  Note sent to Dr Gala Romney.

## 2018-11-01 ENCOUNTER — Telehealth: Payer: Self-pay | Admitting: *Deleted

## 2018-11-01 NOTE — Telephone Encounter (Signed)
-----   Message from Guss Bunde, MD sent at 11/01/2018 10:55 AM EDT ----- Regarding: RE: Breast MRI We can discuss at her Sept visit and see if there is a change in her family history.  Her T-C risk looks normal from 2016.   She can get a consult form dr. Donne Hazel for an opinion.  We discussed that at her last visit.   ----- Message ----- From: Asencion Islam, RN Sent: 10/27/2018   4:01 PM EDT To: Guss Bunde, MD Subject: Breast MRI                                     Carol Montoya, I see where this lady has a family H/O breast cancer(mom died) You ordered a mammogram and U/S which she had in 1/20.  She states that she was to alternate MRI and mammogram.  When does she need the MRI?  Her My Risk was neg.  Not sure if she ever saw Jeanmarie Hubert either.  She is scheduled to see you again in 9/20 for annual.  Thanks,  I figured it was alternating every year but just wasn't sure.

## 2018-11-01 NOTE — Telephone Encounter (Signed)
Spoke with pt after consulting with Dr Gala Romney about breast MRI.   Dr Gala Romney will re discuss the need for MRI or not.  She also had an appt with Dr Serita Grammes and cancelled it and hasn't rescheduled the appt.  She is in agreeance that they will discuss at annual in Sept 2020

## 2019-01-10 ENCOUNTER — Encounter: Payer: Self-pay | Admitting: Obstetrics & Gynecology

## 2019-01-10 ENCOUNTER — Ambulatory Visit (INDEPENDENT_AMBULATORY_CARE_PROVIDER_SITE_OTHER): Payer: BLUE CROSS/BLUE SHIELD | Admitting: Obstetrics & Gynecology

## 2019-01-10 ENCOUNTER — Other Ambulatory Visit: Payer: Self-pay

## 2019-01-10 VITALS — BP 113/76 | HR 84 | Ht 66.0 in | Wt 178.0 lb

## 2019-01-10 DIAGNOSIS — Z1151 Encounter for screening for human papillomavirus (HPV): Secondary | ICD-10-CM | POA: Diagnosis not present

## 2019-01-10 DIAGNOSIS — Z01419 Encounter for gynecological examination (general) (routine) without abnormal findings: Secondary | ICD-10-CM | POA: Diagnosis not present

## 2019-01-10 DIAGNOSIS — Z124 Encounter for screening for malignant neoplasm of cervix: Secondary | ICD-10-CM | POA: Diagnosis not present

## 2019-01-10 DIAGNOSIS — Z319 Encounter for procreative management, unspecified: Secondary | ICD-10-CM

## 2019-01-10 DIAGNOSIS — Z1239 Encounter for other screening for malignant neoplasm of breast: Secondary | ICD-10-CM

## 2019-01-10 NOTE — Progress Notes (Signed)
Subjective:    Carol Montoya is a 38 y.o. married P3 (17, 74, and 77 yo sons)  who presents for an annual exam. The patient has no complaints today. She is considering a pregnancy with her husband who has no biological children. The patient is sexually active. GYN screening history: last pap: was normal. The patient wears seatbelts: yes. The patient participates in regular exercise: yes. Has the patient ever been transfused or tattooed?: no. The patient reports that there is not domestic violence in her life.   Menstrual History: OB History    Gravida  3   Para  3   Term  3   Preterm      AB      Living  3     SAB      TAB      Ectopic      Multiple      Live Births              Menarche age: 50 No LMP recorded. (Menstrual status: IUD).    The following portions of the patient's history were reviewed and updated as appropriate: allergies, current medications, past family history, past medical history, past social history, past surgical history and problem list.  Review of Systems Pertinent items are noted in HPI.   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   Objective:    BP 113/76   Pulse 84   Ht 5' 6"  (1.676 m)   Wt 178 lb (80.7 kg)   BMI 28.73 kg/m   General Appearance:    Alert, cooperative, no distress, appears stated age  Head:    Normocephalic, without obvious abnormality, atraumatic  Eyes:    PERRL, conjunctiva/corneas clear, EOM's intact, fundi    benign, both eyes  Ears:    Normal TM's and external ear canals, both ears  Nose:   Nares normal, septum midline, mucosa normal, no drainage    or sinus tenderness  Throat:   Lips, mucosa, and tongue normal; teeth and gums normal  Neck:   Supple, symmetrical, trachea midline, no adenopathy;    thyroid:  no  enlargement/tenderness/nodules; no carotid   bruit or JVD  Back:     Symmetric, no curvature, ROM normal, no CVA tenderness  Lungs:     Clear to auscultation bilaterally, respirations unlabored  Chest Wall:    No tenderness or deformity   Heart:    Regular rate and rhythm, S1 and S2 normal, no murmur, rub   or gallop  Breast Exam:    No tenderness, masses, or nipple abnormality  Abdomen:     Soft, non-tender, bowel sounds active all four quadrants,    no masses, no organomegaly  Genitalia:    Normal female without lesion, discharge or tenderness, normal size and shape, anteverted, mobile, non-tender, normal adnexal exam Excellent pelvis for vaginal deliveries     Extremities:   Extremities normal, atraumatic, no cyanosis or edema  Pulses:   2+ and symmetric all extremities  Skin:   Skin color, texture, turgor normal, no rashes or lesions  Lymph nodes:   Cervical, supraclavicular, and axillary nodes normal  Neurologic:   CNII-XII intact, normal strength, sensation and reflexes    throughout  .    Assessment:    Healthy female exam.   Desire for  pregnancy, potentially   Plan:     Thin prep Pap smear. with cotesting Refer to RE MRI of breast She will come back for IUD removal prn Start MVI today

## 2019-01-12 LAB — CYTOLOGY - PAP
Diagnosis: NEGATIVE
High risk HPV: NEGATIVE
Molecular Disclaimer: 56
Molecular Disclaimer: DETECTED
Molecular Disclaimer: NORMAL

## 2019-02-08 ENCOUNTER — Other Ambulatory Visit: Payer: Self-pay | Admitting: Obstetrics & Gynecology

## 2019-02-09 ENCOUNTER — Other Ambulatory Visit: Payer: Self-pay | Admitting: Obstetrics & Gynecology

## 2019-02-11 ENCOUNTER — Other Ambulatory Visit: Payer: Self-pay

## 2019-02-11 ENCOUNTER — Ambulatory Visit
Admission: RE | Admit: 2019-02-11 | Discharge: 2019-02-11 | Disposition: A | Discharge status: Home / Self Care | Payer: BLUE CROSS/BLUE SHIELD | Source: Ambulatory Visit | Attending: Obstetrics & Gynecology | Admitting: Obstetrics & Gynecology

## 2019-02-11 DIAGNOSIS — Z1239 Encounter for other screening for malignant neoplasm of breast: Secondary | ICD-10-CM

## 2019-02-11 MED ORDER — GADOBUTROL 1 MMOL/ML IV SOLN
7.0000 mL | Freq: Once | INTRAVENOUS | Status: AC | PRN
  Administered 2019-02-11: 7 mL via INTRAVENOUS

## 2019-02-14 ENCOUNTER — Telehealth: Payer: Self-pay | Admitting: *Deleted

## 2019-02-14 ENCOUNTER — Other Ambulatory Visit: Payer: Self-pay | Admitting: Obstetrics & Gynecology

## 2019-02-14 DIAGNOSIS — N6320 Unspecified lump in the left breast, unspecified quadrant: Secondary | ICD-10-CM

## 2019-02-14 NOTE — Telephone Encounter (Signed)
Carol Montoya left a voicemessage this am stating she just missed a call from [facility] about her MRI results which she is nervous about. Wants a call back. Per chart is Carol Montoya patient. Will forward.  Jashawn Floyd,RN

## 2019-02-14 NOTE — Progress Notes (Signed)
Left breast u/s ordered Patient notified via phone

## 2019-02-15 ENCOUNTER — Other Ambulatory Visit: Payer: Self-pay | Admitting: Obstetrics & Gynecology

## 2019-02-17 ENCOUNTER — Ambulatory Visit
Admission: RE | Admit: 2019-02-17 | Discharge: 2019-02-17 | Disposition: A | Discharge status: Home / Self Care | Payer: BLUE CROSS/BLUE SHIELD | Source: Ambulatory Visit | Attending: Obstetrics & Gynecology | Admitting: Obstetrics & Gynecology

## 2019-02-17 ENCOUNTER — Other Ambulatory Visit: Payer: Self-pay | Admitting: Obstetrics & Gynecology

## 2019-02-17 ENCOUNTER — Other Ambulatory Visit: Payer: Self-pay

## 2019-02-17 DIAGNOSIS — N6320 Unspecified lump in the left breast, unspecified quadrant: Secondary | ICD-10-CM

## 2019-02-17 DIAGNOSIS — R9389 Abnormal findings on diagnostic imaging of other specified body structures: Secondary | ICD-10-CM

## 2019-02-24 ENCOUNTER — Encounter: Payer: Self-pay | Admitting: *Deleted

## 2019-03-03 ENCOUNTER — Other Ambulatory Visit: Payer: Self-pay

## 2019-03-03 ENCOUNTER — Ambulatory Visit
Admission: RE | Admit: 2019-03-03 | Discharge: 2019-03-03 | Disposition: A | Discharge status: Home / Self Care | Payer: BLUE CROSS/BLUE SHIELD | Source: Ambulatory Visit | Attending: Obstetrics & Gynecology | Admitting: Obstetrics & Gynecology

## 2019-03-03 ENCOUNTER — Other Ambulatory Visit: Payer: Self-pay | Admitting: Body Imaging

## 2019-03-03 DIAGNOSIS — R9389 Abnormal findings on diagnostic imaging of other specified body structures: Secondary | ICD-10-CM

## 2019-03-03 MED ORDER — GADOBUTROL 1 MMOL/ML IV SOLN
8.0000 mL | Freq: Once | INTRAVENOUS | Status: AC | PRN
  Administered 2019-03-03: 10:00:00 8 mL via INTRAVENOUS

## 2019-03-04 ENCOUNTER — Other Ambulatory Visit: Payer: Self-pay | Admitting: Obstetrics & Gynecology

## 2019-03-04 DIAGNOSIS — R9389 Abnormal findings on diagnostic imaging of other specified body structures: Secondary | ICD-10-CM

## 2019-03-14 ENCOUNTER — Other Ambulatory Visit: Payer: Self-pay

## 2019-03-14 ENCOUNTER — Ambulatory Visit
Admission: RE | Admit: 2019-03-14 | Discharge: 2019-03-14 | Disposition: A | Discharge status: Home / Self Care | Payer: BLUE CROSS/BLUE SHIELD | Source: Ambulatory Visit | Attending: Obstetrics & Gynecology | Admitting: Obstetrics & Gynecology

## 2019-03-14 ENCOUNTER — Other Ambulatory Visit: Payer: Self-pay | Admitting: Radiology

## 2019-03-14 DIAGNOSIS — R9389 Abnormal findings on diagnostic imaging of other specified body structures: Secondary | ICD-10-CM

## 2019-03-14 MED ORDER — GADOBUTROL 1 MMOL/ML IV SOLN
8.0000 mL | Freq: Once | INTRAVENOUS | Status: AC | PRN
  Administered 2019-03-14: 8 mL via INTRAVENOUS

## 2019-04-28 ENCOUNTER — Other Ambulatory Visit: Payer: Self-pay | Admitting: *Deleted

## 2019-04-28 MED ORDER — TOPIRAMATE 100 MG PO TABS: 100.0000 mg | ORAL_TABLET | Freq: Two times a day (BID) | ORAL | 6 refills | Status: DC

## 2019-07-12 ENCOUNTER — Other Ambulatory Visit: Payer: Self-pay | Admitting: *Deleted

## 2019-07-12 DIAGNOSIS — Z1239 Encounter for other screening for malignant neoplasm of breast: Secondary | ICD-10-CM

## 2019-07-15 ENCOUNTER — Other Ambulatory Visit: Payer: Self-pay | Admitting: Obstetrics & Gynecology

## 2019-07-15 DIAGNOSIS — Z1231 Encounter for screening mammogram for malignant neoplasm of breast: Secondary | ICD-10-CM

## 2019-09-14 ENCOUNTER — Other Ambulatory Visit: Payer: Self-pay

## 2019-09-14 ENCOUNTER — Ambulatory Visit
Admission: RE | Admit: 2019-09-14 | Discharge: 2019-09-14 | Disposition: A | Discharge status: Home / Self Care | Payer: BC Managed Care – PPO | Source: Ambulatory Visit | Attending: Obstetrics & Gynecology | Admitting: Obstetrics & Gynecology

## 2019-09-14 DIAGNOSIS — Z1231 Encounter for screening mammogram for malignant neoplasm of breast: Secondary | ICD-10-CM

## 2019-12-02 ENCOUNTER — Other Ambulatory Visit: Payer: Self-pay | Admitting: Family Medicine

## 2019-12-02 ENCOUNTER — Other Ambulatory Visit: Payer: Self-pay | Admitting: Obstetrics & Gynecology

## 2019-12-02 DIAGNOSIS — Z1231 Encounter for screening mammogram for malignant neoplasm of breast: Secondary | ICD-10-CM

## 2020-01-04 ENCOUNTER — Other Ambulatory Visit: Payer: Self-pay

## 2020-01-04 ENCOUNTER — Ambulatory Visit
Admission: RE | Admit: 2020-01-04 | Discharge: 2020-01-04 | Disposition: A | Discharge status: Home / Self Care | Payer: BC Managed Care – PPO | Source: Ambulatory Visit | Attending: *Deleted | Admitting: *Deleted

## 2020-01-04 DIAGNOSIS — Z1231 Encounter for screening mammogram for malignant neoplasm of breast: Secondary | ICD-10-CM

## 2020-02-17 IMAGING — MR MR BREAST BILAT WO/W CM
8 of 12 series · 33 of 48 positions shown · IV contrast (gadavist)
Comparison: Mammogram May 10, 2018

CLINICAL DATA: Screening breast MRI. Lifetime calculated risk for
breast cancer is greater than 20%.

LABS:  None obtained
EXAM:
BILATERAL BREAST MRI WITH AND WITHOUT CONTRAST
TECHNIQUE: Multiplanar, multisequence MR images of both breasts were obtained
prior to and following the intravenous administration of 7 ml of
Gadavist

[Series 2: t2_tirm_tra ipat (a-p) · axial · 3.0mm · 0.70mm/px · 1 of 55 slices shown]
[im 1/55]
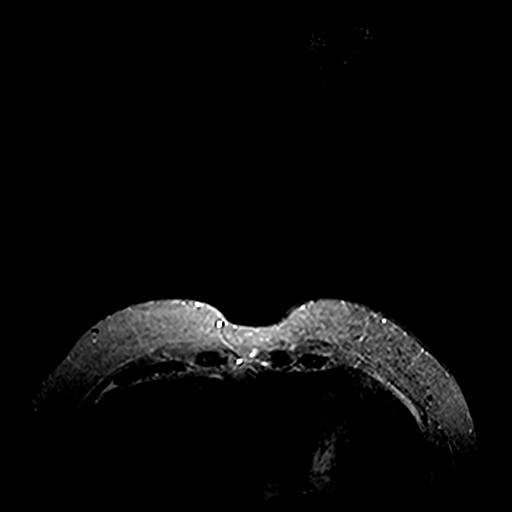

[Series 3: fl3d pre-cm no · axial · non-contrast · 1.2mm · 0.94mm/px · z∈[-63,+109]mm · 5 of 144 slices shown]
[im 1/144]
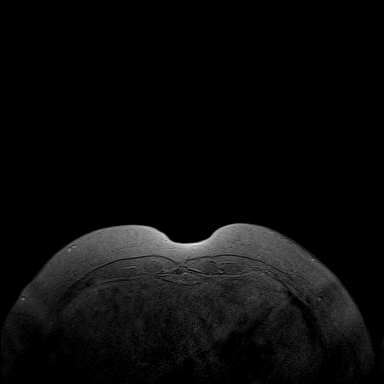
[im 36/144]
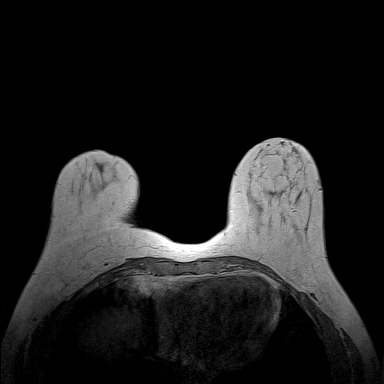
[im 72/144]
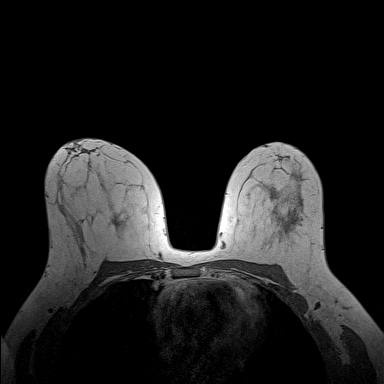
[im 108/144]
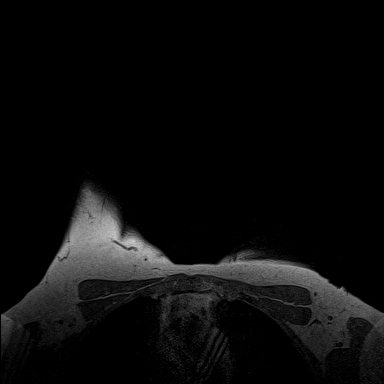
[im 144/144]
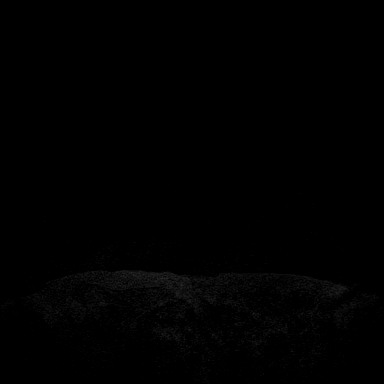

[Series 4: fl3d pre-cm · axial · non-contrast · 1.2mm · 0.94mm/px · z∈[-63,+109]mm · 5 of 144 slices shown]
[im 1/144]
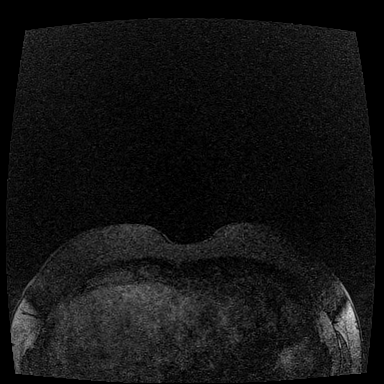
[im 36/144]
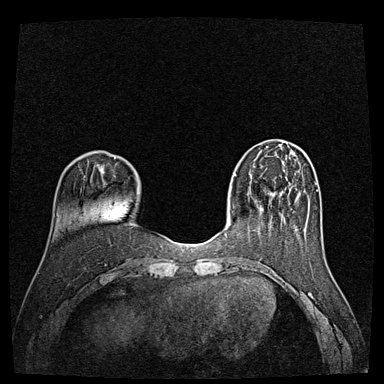
[im 72/144]
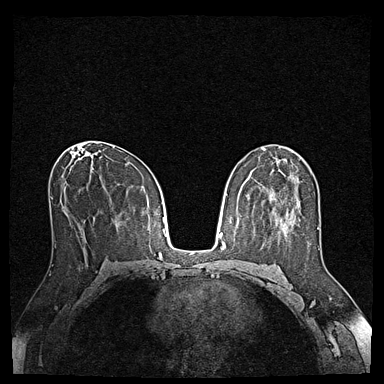
[im 108/144]
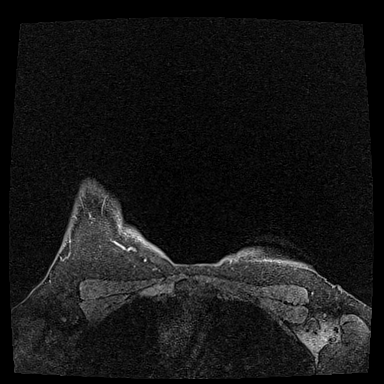
[im 144/144]
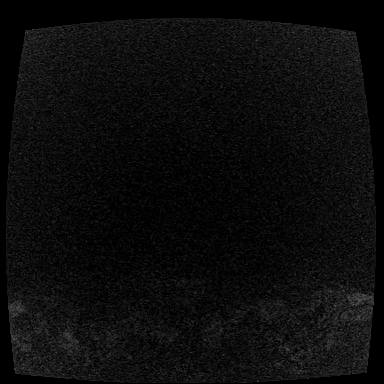

[Series 5: fl3d post-cm 20 · axial · 1.2mm · 0.94mm/px · z∈[-63,+109]mm · 5 of 144 slices shown (1 of 3)]
[im 1/144]
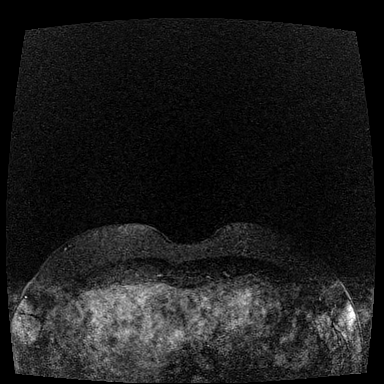
[im 36/144]
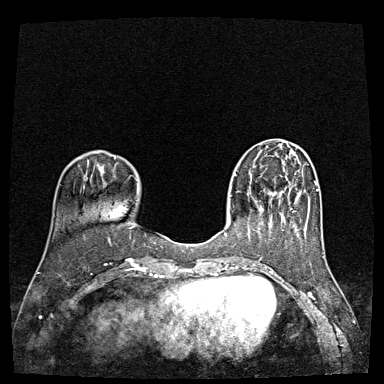
[im 72/144]
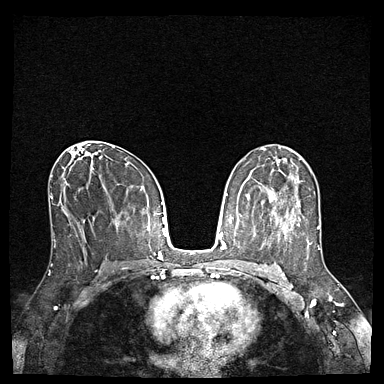
[im 108/144]
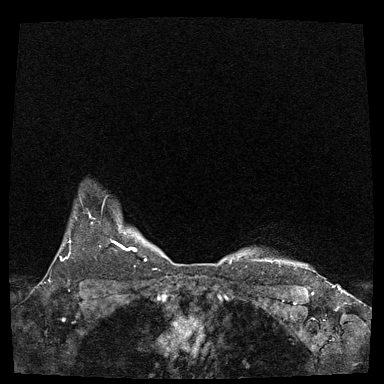
[im 144/144]
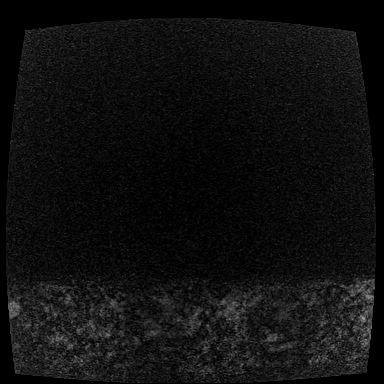

[Series 6: fl3d post-cm 20 · axial · 1.2mm · 0.94mm/px · z∈[-63,+109]mm · 5 of 144 slices shown (2 of 3)]
[im 1/144]
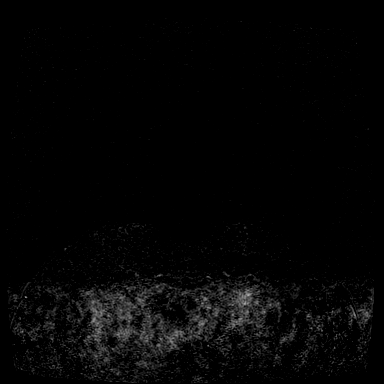
[im 36/144]
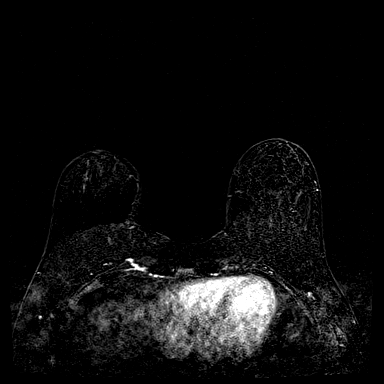
[im 72/144]
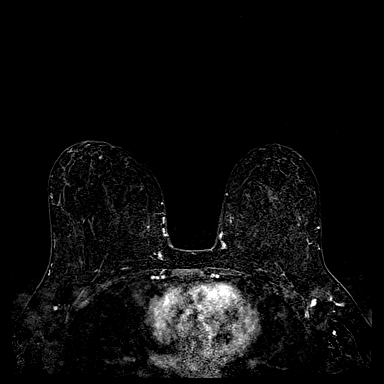
[im 108/144]
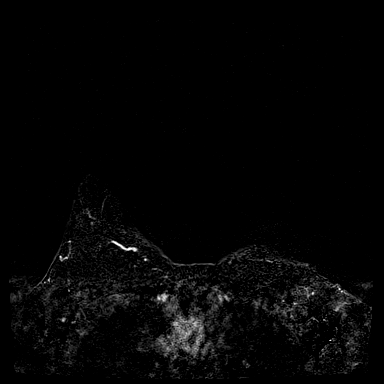
[im 144/144]
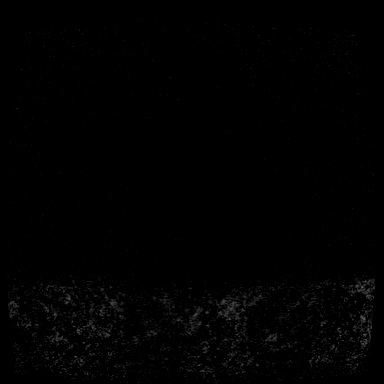

[Series 7: fl3d post-cm 20 · axial · 172.8mm · 0.94mm/px · 1 of 1 slices shown (3 of 3)]
[im 1/1]
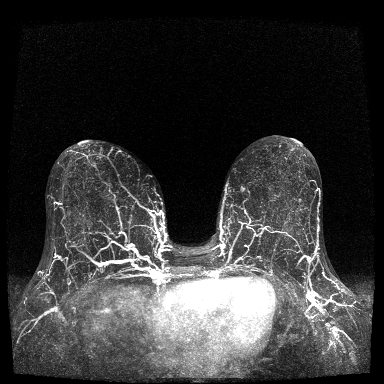

[Series 8: fl3d post-cm 3min · axial · 1.2mm · 0.94mm/px · z∈[-63,+109]mm · 6 of 144 slices shown]
[im 1/144]
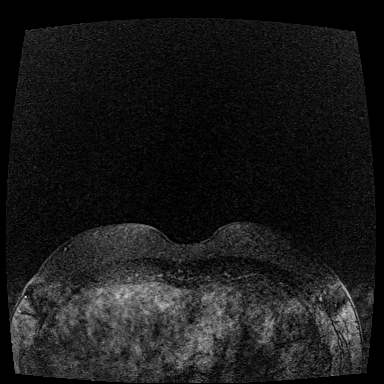
[im 29/144]
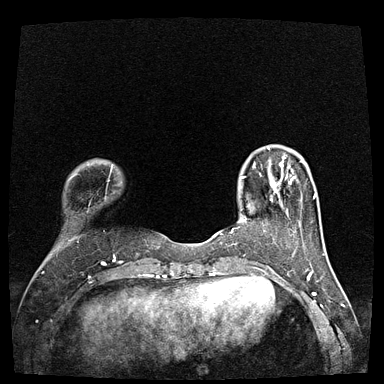
[im 58/144]
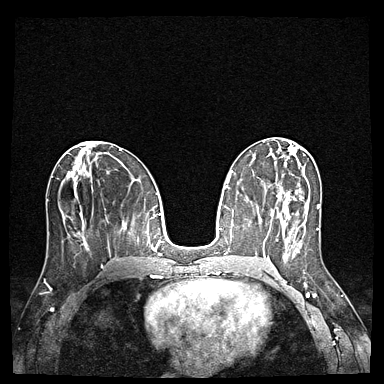
[im 86/144]
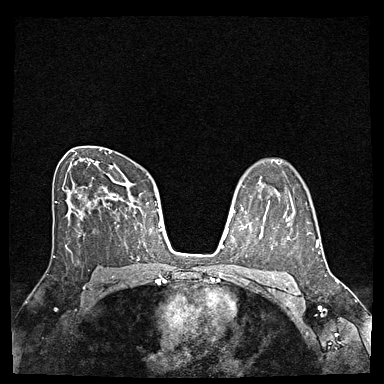
[im 115/144]
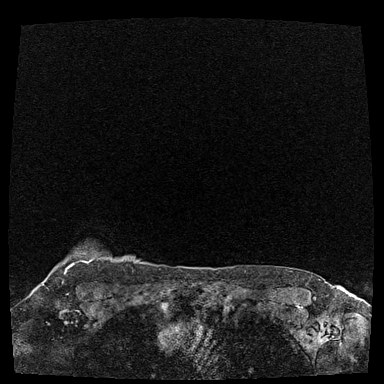
[im 144/144]
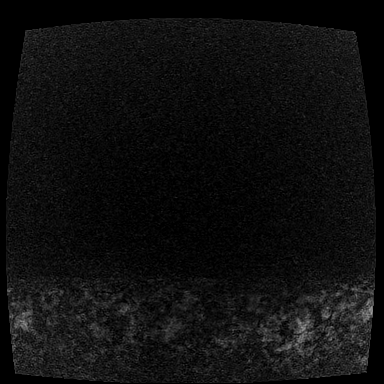

[Series 9: fl3d post-cm 3min_sub · axial · 1.2mm · 0.94mm/px · z∈[-63,+74]mm · 5 of 144 slices shown]
[im 1/144]
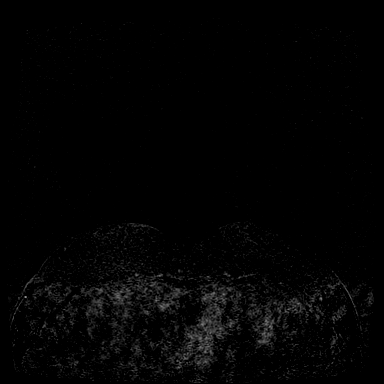
[im 29/144]
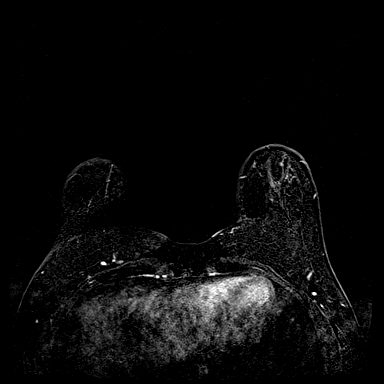
[im 58/144]
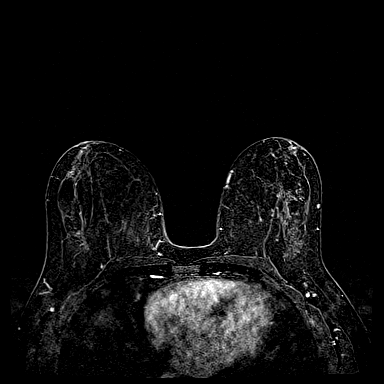
[im 86/144]
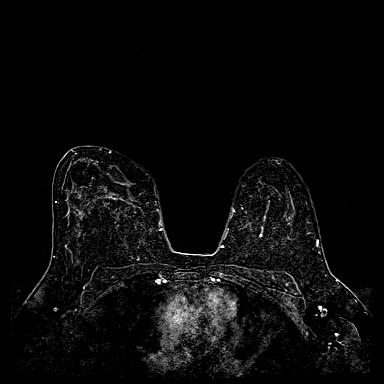
[im 115/144]
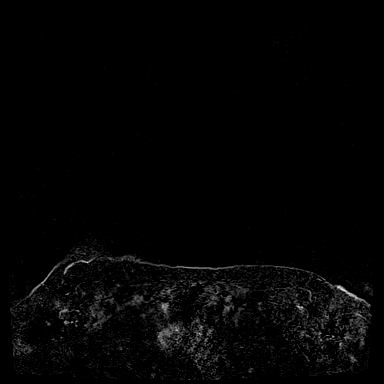

[33 of 48 positions shown; findings below may reference images not displayed]

Three-dimensional MR images were rendered by post-processing of the
original MR data on an independent workstation. The
three-dimensional MR images were interpreted, and findings are
reported in the following complete MRI report for this study. Three
dimensional images were evaluated at the independent [employer]
workstation
FINDINGS: Breast composition: b. Scattered fibroglandular tissue.

Background parenchymal enhancement: Mild

Right breast: No mass or abnormal enhancement.

Left breast: In the middle third of the lower inner quadrant of the
left breast is an oval T2 bright enhancing mass measuring 6 x 5 x 6
mm.

Lymph nodes: No abnormal appearing lymph nodes.

Ancillary findings:  None.
IMPRESSION: 6 mm enhancing mass in the lower inner quadrant of the left breast.
Malignancy cannot be excluded.

RECOMMENDATION:
A second-look ultrasound of the left breast is recommended. If a
sonographic correlate to the MRI detected mass is identified, then
biopsy can be performed under ultrasound guidance. If a sonographic
correlate is not identified, then the patient will be scheduled for
an MRI guided core needle biopsy of the left breast mass.

No MRI evidence of malignancy in the right breast.

BI-RADS CATEGORY  4: Suspicious.

## 2020-03-12 ENCOUNTER — Ambulatory Visit: Payer: BC Managed Care – PPO | Admitting: Obstetrics & Gynecology

## 2020-04-02 ENCOUNTER — Encounter: Payer: Self-pay | Admitting: Obstetrics & Gynecology

## 2020-04-02 ENCOUNTER — Ambulatory Visit (INDEPENDENT_AMBULATORY_CARE_PROVIDER_SITE_OTHER): Payer: BC Managed Care – PPO | Admitting: Obstetrics & Gynecology

## 2020-04-02 ENCOUNTER — Other Ambulatory Visit: Payer: Self-pay

## 2020-04-02 VITALS — BP 110/69 | HR 77 | Resp 16 | Ht 66.0 in | Wt 175.0 lb

## 2020-04-02 DIAGNOSIS — Z803 Family history of malignant neoplasm of breast: Secondary | ICD-10-CM

## 2020-04-02 DIAGNOSIS — Z01419 Encounter for gynecological examination (general) (routine) without abnormal findings: Secondary | ICD-10-CM

## 2020-04-02 NOTE — Progress Notes (Signed)
Subjective:     Carol Montoya is a 39 y.o. female here for a routine exam.  Current complaints: none.  Satisfied with IUD; has decided not to have another child (IUD placed 2017)   Gynecologic History No LMP recorded. (Menstrual status: IUD). Contraception: IUD Last Pap: 2020. Results were: normal Last mammogram: 12/2019. Results were: normal  Obstetric History OB History  Gravida Para Term Preterm AB Living  3 3 3     3   SAB IAB Ectopic Multiple Live Births               # Outcome Date GA Lbr Len/2nd Weight Sex Delivery Anes PTL Lv  3 Term 01/10/10    M Vag-Spont     2 Term 02/19/06    M Vag-Spont     1 Term 11/25/01    M Vag-Spont        The following portions of the patient's history were reviewed and updated as appropriate: allergies, current medications, past family history, past medical history, past social history, past surgical history and problem list.  Review of Systems Pertinent items noted in HPI and remainder of comprehensive ROS otherwise negative.    Objective:      Vitals:   04/02/20 0929  BP: 110/69  Pulse: 77  Resp: 16  Weight: 175 lb (79.4 kg)  Height: 5\' 6"  (1.676 m)   Vitals:  WNL General appearance: alert, cooperative and no distress  HEENT: Normocephalic, without obvious abnormality, atraumatic Eyes: negative Throat: lips, mucosa, and tongue normal; teeth and gums normal  Respiratory: Clear to auscultation bilaterally  CV: Regular rate and rhythm  Breasts:  Normal appearance, no masses or tenderness, no nipple retraction or dimpling; implants  GI: Soft, non-tender; bowel sounds normal; no masses,  no organomegaly  GU: External Genitalia:  Tanner V, no lesion Urethra:  No prolapse   Vagina: Pink, normal rugae, no blood or discharge  Cervix: No CMT, no lesion  Uterus:  Normal size and contour, non tender  Adnexa: Normal, no masses, non tender  Musculoskeletal: No edema, redness or tenderness in the calves or thighs  Skin: No lesions  or rash  Lymphatic: Axillary adenopathy: none     Psychiatric: Normal mood and behavior       Assessment:    Healthy female exam.    Plan:   Continue IUD Alternate mammogram and MRI as discussed due to lifetime risk >20% Colonoscopy age 59 Pt is vaccinated for Covid PCP following risk of health maintenance

## 2020-05-28 ENCOUNTER — Other Ambulatory Visit: Payer: Self-pay

## 2020-05-28 ENCOUNTER — Ambulatory Visit (INDEPENDENT_AMBULATORY_CARE_PROVIDER_SITE_OTHER): Payer: BC Managed Care – PPO | Admitting: Obstetrics & Gynecology

## 2020-05-28 ENCOUNTER — Encounter: Payer: Self-pay | Admitting: Obstetrics & Gynecology

## 2020-05-28 VITALS — BP 93/65 | HR 73 | Resp 16 | Ht 66.0 in | Wt 176.0 lb

## 2020-05-28 DIAGNOSIS — F902 Attention-deficit hyperactivity disorder, combined type: Secondary | ICD-10-CM | POA: Insufficient documentation

## 2020-05-28 DIAGNOSIS — G43709 Chronic migraine without aura, not intractable, without status migrainosus: Secondary | ICD-10-CM

## 2020-05-28 DIAGNOSIS — F3289 Other specified depressive episodes: Secondary | ICD-10-CM

## 2020-05-28 DIAGNOSIS — R6882 Decreased libido: Secondary | ICD-10-CM | POA: Diagnosis not present

## 2020-05-28 DIAGNOSIS — G4700 Insomnia, unspecified: Secondary | ICD-10-CM | POA: Insufficient documentation

## 2020-05-28 DIAGNOSIS — G43909 Migraine, unspecified, not intractable, without status migrainosus: Secondary | ICD-10-CM | POA: Insufficient documentation

## 2020-05-28 NOTE — Progress Notes (Signed)
   Subjective:    Patient ID: Carol Montoya, female    DOB: 1981/02/05, 40 y.o.   MRN: [id number]  HPI  40 yo female presents for lont term low libido.  She wants to want to have sex.  Pt in monogamous relationship for 10 years.  Pt has slightly increased desire during ovulation.  Pt had more sex drive in teens but still not normal.  Pt does have intercourse 1-2 times a week and feels she is going through the motions.  Husband and her discuss openly the decreased libido.  Pt ha lifelong refractory depression.  Topomax was added in 2010 off label and found to help headaches.  She has not tried other preventatives for migraines.  Pt is seeing PCP today and will discuss CGRP receptor meds (injectable or oral).  Pt has a psychiatrist who manages her depression.  She does still feel depressed and anxious.   Review of Systems  Constitutional: Positive for fatigue.  Respiratory: Negative.   Cardiovascular: Negative.   Genitourinary: Negative for dyspareunia.  Psychiatric/Behavioral: Positive for dysphoric mood.       Objective:   Physical Exam Vitals reviewed.  Constitutional:      General: She is not in acute distress.    Appearance: She is well-developed and well-nourished.  HENT:     Head: Normocephalic and atraumatic.  Eyes:     Conjunctiva/sclera: Conjunctivae normal.  Cardiovascular:     Rate and Rhythm: Normal rate.  Pulmonary:     Effort: Pulmonary effort is normal.  Musculoskeletal:        General: No edema.  Skin:    General: Skin is warm and dry.  Neurological:     Mental Status: She is alert and oriented to person, place, and time.  Psychiatric:        Mood and Affect: Mood and affect normal.     Comments: Blunted affect and appears fatigued    Vitals:   05/28/20 0950  BP: 93/65  Pulse: 73  Resp: 16  Weight: 176 lb (79.8 kg)  Height: 5\' 6"  (1.676 m)      Assessment & Plan:  40 yo female with depression and decreased libido. 1.  Suggest discussing with  psychiatrist change in therapy.  Is she a candidate for TMS or nasal ketamine? 2.  Discuss with PCP changing migraine preventative to CGRP class.  Topomax is sedating and brain fog.   3.  Referred to [facility] in [place]  22 minutes spent with patient, counseling, review of records and documentation

## 2020-06-29 ENCOUNTER — Ambulatory Visit
Admission: RE | Admit: 2020-06-29 | Discharge: 2020-06-29 | Disposition: A | Discharge status: Home / Self Care | Payer: BC Managed Care – PPO | Source: Ambulatory Visit | Attending: Obstetrics & Gynecology | Admitting: Obstetrics & Gynecology

## 2020-06-29 DIAGNOSIS — Z803 Family history of malignant neoplasm of breast: Secondary | ICD-10-CM

## 2020-06-29 MED ORDER — GADOBUTROL 1 MMOL/ML IV SOLN
7.0000 mL | Freq: Once | INTRAVENOUS | Status: AC | PRN
  Administered 2020-06-29: 7 mL via INTRAVENOUS

## 2020-07-04 ENCOUNTER — Other Ambulatory Visit: Payer: Self-pay | Admitting: Obstetrics & Gynecology

## 2020-07-04 DIAGNOSIS — R9389 Abnormal findings on diagnostic imaging of other specified body structures: Secondary | ICD-10-CM

## 2020-07-05 ENCOUNTER — Telehealth: Payer: Self-pay | Admitting: Obstetrics & Gynecology

## 2020-07-05 NOTE — Telephone Encounter (Signed)
Carol Montoya had questions about MRI result.  AFter our discussion she will go forward with Breast biopsy of right breast.  She tried to get an appt with Dr. Dwain Sarna but was unable.  My office will help her get a consultation to review her lifetime risk, and latest MRI guided breast biopsy results (being scheduled today, so appt with Dr. Dwain Sarna should be 1 week after the biopsy is completed).

## 2020-07-25 ENCOUNTER — Other Ambulatory Visit: Payer: Self-pay | Admitting: *Deleted

## 2020-07-25 DIAGNOSIS — R928 Other abnormal and inconclusive findings on diagnostic imaging of breast: Secondary | ICD-10-CM

## 2020-07-30 ENCOUNTER — Encounter: Payer: Self-pay | Admitting: *Deleted

## 2020-08-06 ENCOUNTER — Ambulatory Visit: Payer: BC Managed Care – PPO

## 2020-08-06 ENCOUNTER — Ambulatory Visit
Admission: RE | Admit: 2020-08-06 | Discharge: 2020-08-06 | Disposition: A | Discharge status: Home / Self Care | Payer: BC Managed Care – PPO | Source: Ambulatory Visit | Attending: Obstetrics & Gynecology | Admitting: Obstetrics & Gynecology

## 2020-08-06 ENCOUNTER — Other Ambulatory Visit: Payer: Self-pay

## 2020-08-06 ENCOUNTER — Other Ambulatory Visit: Payer: Self-pay | Admitting: Obstetrics & Gynecology

## 2020-08-06 DIAGNOSIS — R9389 Abnormal findings on diagnostic imaging of other specified body structures: Secondary | ICD-10-CM

## 2020-08-06 DIAGNOSIS — G629 Polyneuropathy, unspecified: Secondary | ICD-10-CM | POA: Insufficient documentation

## 2020-08-06 MED ORDER — GADOBUTROL 1 MMOL/ML IV SOLN
9.0000 mL | Freq: Once | INTRAVENOUS | Status: AC | PRN
  Administered 2020-08-06: 9 mL via INTRAVENOUS

## 2020-08-22 ENCOUNTER — Ambulatory Visit: Admission: RE | Admit: 2020-08-22 | Payer: BC Managed Care – PPO | Source: Ambulatory Visit

## 2020-08-22 ENCOUNTER — Ambulatory Visit
Admission: RE | Admit: 2020-08-22 | Discharge: 2020-08-22 | Disposition: A | Discharge status: Home / Self Care | Payer: BC Managed Care – PPO | Source: Ambulatory Visit | Attending: Obstetrics & Gynecology | Admitting: Obstetrics & Gynecology

## 2020-08-22 ENCOUNTER — Other Ambulatory Visit: Payer: Self-pay | Admitting: Obstetrics & Gynecology

## 2020-08-22 ENCOUNTER — Other Ambulatory Visit: Payer: Self-pay

## 2020-08-22 DIAGNOSIS — R9389 Abnormal findings on diagnostic imaging of other specified body structures: Secondary | ICD-10-CM

## 2020-08-22 MED ORDER — GADOBUTROL 1 MMOL/ML IV SOLN
7.0000 mL | Freq: Once | INTRAVENOUS | Status: AC | PRN
  Administered 2020-08-22: 7 mL via INTRAVENOUS

## 2021-01-25 ENCOUNTER — Other Ambulatory Visit: Payer: Self-pay | Admitting: *Deleted

## 2021-01-29 ENCOUNTER — Other Ambulatory Visit: Payer: Self-pay | Admitting: *Deleted

## 2021-01-29 DIAGNOSIS — Z1239 Encounter for other screening for malignant neoplasm of breast: Secondary | ICD-10-CM

## 2021-01-30 ENCOUNTER — Other Ambulatory Visit: Payer: Self-pay | Admitting: Obstetrics & Gynecology

## 2021-01-30 DIAGNOSIS — Z1239 Encounter for other screening for malignant neoplasm of breast: Secondary | ICD-10-CM

## 2021-02-23 ENCOUNTER — Other Ambulatory Visit: Payer: BC Managed Care – PPO

## 2021-03-12 ENCOUNTER — Telehealth: Payer: Self-pay | Admitting: *Deleted

## 2021-03-12 NOTE — Telephone Encounter (Signed)
Left patient a message to call and schedule annual with Dr. Leggett that is due after 04/02/2021. 

## 2021-04-08 ENCOUNTER — Ambulatory Visit: Payer: BC Managed Care – PPO | Admitting: Obstetrics & Gynecology

## 2021-04-23 NOTE — Progress Notes (Signed)
Last pap- 01/10/19- negative

## 2021-04-25 ENCOUNTER — Encounter: Payer: Self-pay | Admitting: Obstetrics & Gynecology

## 2021-04-25 ENCOUNTER — Other Ambulatory Visit: Payer: Self-pay

## 2021-04-25 ENCOUNTER — Ambulatory Visit (INDEPENDENT_AMBULATORY_CARE_PROVIDER_SITE_OTHER): Payer: BC Managed Care – PPO | Admitting: Obstetrics & Gynecology

## 2021-04-25 VITALS — BP 118/79 | HR 78 | Ht 67.0 in | Wt 193.0 lb

## 2021-04-25 DIAGNOSIS — Z01419 Encounter for gynecological examination (general) (routine) without abnormal findings: Secondary | ICD-10-CM | POA: Diagnosis not present

## 2021-04-25 DIAGNOSIS — Z803 Family history of malignant neoplasm of breast: Secondary | ICD-10-CM

## 2021-04-25 NOTE — Progress Notes (Addendum)
Subjective:     Carol Montoya is a 41 y.o. female here for a routine exam.  Current complaints: needs breast MRI and worried they will find something else to biopsy (pt has had several biopsies with all benign path).  Happy with IUD.  No bleeding   Gynecologic History No LMP recorded. (Menstrual status: IUD). Contraception: IUD since 2017 Last Pap: 2020. Results were: normal Alternates mammograms and MRIs for family hx of breast cancer; last mri 5/22 is normal; Sept 2022 normal mammo in care everywhere  Obstetric History OB History  Gravida Para Term Preterm AB Living  3 3 3     3   SAB IAB Ectopic Multiple Live Births               # Outcome Date GA Lbr Len/2nd Weight Sex Delivery Anes PTL Lv  3 Term 01/10/10    M Vag-Spont     2 Term 02/19/06    M Vag-Spont     1 Term 11/25/01    M Vag-Spont        The following portions of the patient's history were reviewed and updated as appropriate: allergies, current medications, past family history, past medical history, past social history, past surgical history, and problem list.  Review of Systems Pertinent items noted in HPI and remainder of comprehensive ROS otherwise negative.    Objective:   Vitals:   04/25/21 0838  BP: 118/79  Pulse: 78  Weight: 193 lb (87.5 kg)  Height: 5\' 7"  (1.702 m)   Vitals:  WNL General appearance: alert, cooperative and no distress  HEENT: Normocephalic, without obvious abnormality, atraumatic Eyes: negative Throat: lips, mucosa, and tongue normal; teeth and gums normal  Respiratory: Clear to auscultation bilaterally  CV: Regular rate and rhythm  Breasts:  Normal appearance, no masses or tenderness, no nipple retraction or dimpling  GI: Soft, non-tender; bowel sounds normal; no masses,  no organomegaly  GU: External Genitalia:  Tanner V, no lesion Urethra:  No prolapse   Vagina: Pink, normal rugae, no blood or discharge  Cervix: No CMT, no lesion, short IUD strings seen  Uterus:  Normal  size and contour, non tender  Adnexa: Normal, no masses, non tender  Musculoskeletal: No edema, redness or tenderness in the calves or thighs  Skin: No lesions or rash  Lymphatic: Axillary adenopathy: none     Psychiatric: Normal mood and behavior      Assessment:    Healthy female exam.    Plan:   1.  Pap up to date 2. Continue to alternate mammogram and MRI q 6 months;  3.  Pt desires colonoscopy at 30

## 2021-04-25 NOTE — Progress Notes (Signed)
08/22/20 Last Mammo Normal

## 2021-04-29 ENCOUNTER — Encounter: Payer: BC Managed Care – PPO | Admitting: Obstetrics & Gynecology

## 2021-07-02 ENCOUNTER — Encounter: Payer: Self-pay | Admitting: Obstetrics & Gynecology

## 2021-07-18 ENCOUNTER — Encounter: Payer: Self-pay | Admitting: Obstetrics & Gynecology

## 2022-02-17 ENCOUNTER — Ambulatory Visit: Payer: BC Managed Care – PPO | Admitting: Obstetrics & Gynecology

## 2022-02-17 ENCOUNTER — Encounter: Payer: Self-pay | Admitting: Obstetrics & Gynecology

## 2022-02-17 VITALS — BP 119/79 | HR 67 | Ht 67.0 in | Wt 192.0 lb

## 2022-02-17 DIAGNOSIS — Z30432 Encounter for removal of intrauterine contraceptive device: Secondary | ICD-10-CM | POA: Diagnosis not present

## 2022-02-17 NOTE — Progress Notes (Signed)
   Subjective:    Patient ID: Carol Montoya, female    DOB: Aug 24, 1980, 41 y.o.   MRN: [id number]  HPI  Carol Montoya is a [id number] female who presents for IUD removal.  Her husband had a vasectomy and would like IUD removed.  She experiences amenorrhea with IUD.  No complaints or issues today .   Review of Systems  Constitutional: Negative.   Respiratory: Negative.    Cardiovascular: Negative.   Gastrointestinal: Negative.   Genitourinary: Negative.        Objective:   Physical Exam Vitals reviewed.  Constitutional:      General: She is not in acute distress.    Appearance: She is well-developed.  HENT:     Head: Normocephalic and atraumatic.  Eyes:     Conjunctiva/sclera: Conjunctivae normal.  Cardiovascular:     Rate and Rhythm: Normal rate.  Pulmonary:     Effort: Pulmonary effort is normal.  Genitourinary:    General: Normal vulva.     Comments: Cervix:  no lesion, short IUD strings Skin:    General: Skin is warm and dry.  Neurological:     Mental Status: She is alert and oriented to person, place, and time.  Psychiatric:        Mood and Affect: Mood normal.    Vitals:   02/17/22 0900  BP: 119/79  Pulse: 67  Weight: 192 lb (87.1 kg)  Height: 5\' 7"  (1.702 m)      Assessment & Plan:  41 yo female desiring IUD removal   Anticipatory guidance around menstrual cycles--can use IUD for cycle control if it becomes an issue.  Followed by [facility] for increased risk of breast cancer.  Pap normal 12/2018--negative cotesting.   IUD Removal Patient identified, informed consent performed. Discussed risks of irregular bleeding, cramping, infection, malpositioning or misplacement of the IUD outside the uterus which may require further procedures. Time out was performed. Speculum placed in the vagina. Cervix visualized. The strings of the IUD were grasped and pulled using ring forceps. The IUD was successfully removed in its entirety.

## 2022-05-22 NOTE — Progress Notes (Signed)
Last Mammogram: 5/23 Last Pap Smear:  01/10/19- negative Last Colon Screening;  never Seat Belts:   yes Sun Screen:   yes Dental Check Up:  yes Brush & Floss:  yes

## 2022-05-23 NOTE — Progress Notes (Signed)
 Assessment and Plan   1. Right arm pain  Xr Elbow Min 3 Views Right   XR Shoulder Min 2 Views Right   predniSONE (DELTASONE) 20 mg tablet    2. Right arm weakness  Xr Elbow Min 3 Views Right   XR Shoulder Min 2 Views Right   predniSONE (DELTASONE) 20 mg tablet     The patient presenting with right arm pain and weakness.  It seems to be diffuse in nature without 1 particular location to blame.  Initially thought this may be more related to an ulnar neuropathy, but it would not explain her point tenderness in the upper arm.  It also would not explain pain with constant palpation over the wrist.  X-rays are unremarkable.  Trial of prednisone.  Discussed if her symptoms fail to improve, I would like her to be reevaluated by neurology as her symptoms do not really correlate with any particular orthopedic type injury.    Risks, benefits, and alternatives of the medications and treatment plan prescribed today were discussed, and patient expressed understanding. Plan follow-up as discussed or as needed if any worsening symptoms or change in condition.  Patient voiced understanding of the treatment plan and agreed to attempt to comply.  No follow-ups on file.     Patient's Medications       * Accurate as of May 23, 2022  5:01 PM. Reflects encounter med changes as of last refresh          New Prescriptions      Instructions  predniSONE 20 mg tablet Commonly known as: DELTASONE Started by: Camelia GORMAN Kidney, MD  Take 3 tablets for 2 days, then 2 tablets for 8 days, then one tablet for 2 days.       Continued Medications      Instructions  amphetamine-dextroamphetamine 30 MG 24 hr capsule Commonly known as: ADDERALL XR  30 mg, Oral, Every morning   EPINEPHrine  0.3 mg/0.3 mL injection Commonly known as: AUVI-Q ,EPIPEN   0.3 mg, Intramuscular, Once as needed   QSYMIA 7.5-46 MG Cp24 per 24 hr capsule Generic drug: Phentermine-Topiramate   1 capsule, Oral, Daily        Discontinued Medications    pantoprazole sodium 20 mg tablet Commonly known as: PROTONIX Stopped by: Camelia GORMAN Kidney, MD         This note was dictated with voice recognition software. Similar sounding words can inadvertently be transcribed and may not be corrected upon review.      Subjective   Patient ID:  Carol Montoya is a 42 y.o. (DOB 1980/08/27) female who presents with:     Patient presents with  . Arm Pain    Bilateral Arm Pain. No known inj. Patient describes pain as tingling/numbing sensation. Patient states this has been an ongoing issue for about 1 year. Patient has tried gabapentin, icy hot and aleve but relief. Patient states Sx's are worse at night     Chief complaint comments reviewed and discussed with patient in detail.  HPI    The patient is presenting with ongoing R arm paresthesias.  She has been seen by neurology, most recently July 2022. The patient had an MRI of the cervical spine in July 2022 which revealed minimal disc bulge at C6/7 without stenosis or neuroforaminal narrowing.  She's had ongoing pain in the R arm for several years, over the last 2 months, it got much worse.  She has a constant throbbing pain. Its much worse with  any type of pressure (even light touch). Shooting pain from the elbow down into the mid forearm (not into the fingers).  This feels worse than when she saw neurology. The whole arm feels heavy  It's now weak which she didn't have before. Before it was pins / needles sensation, now more pain and weakness.  Capsacin, gabapentin 300mg  BID, Lyrica, IBU, Tylenol, ice, heat have not worked.  Meds tried back in 2022: gabapentin, Lyrica, Cymbalta, Elavil, baclofen, Norco  She's worried this might be a sign of something very bad.  Allergies  Allergen Reactions  . Bee Venom Anaphylaxis  . Shellfish Allergy Swelling    Outpatient Medications Marked as Taking for the 05/23/22 encounter (Office Visit) with Camelia GORMAN Kidney, MD  Medication Sig Dispense Refill  . amphetamine-dextroamphetamine (ADDERALL XR) 30 MG 24 hr capsule Take one capsule (30 mg dose) by mouth every morning. 30 capsule 0  . EPINEPHrine  (AUVI-Q ,EPIPEN ) 0.3 mg/0.3 mL injection Inject 0.3 mLs (0.3 mg dose) into the muscle once as needed for Anaphylaxis.    SABRA Phentermine-Topiramate  (QSYMIA) 7.5-46 MG CP24 per 24 hr capsule Take one capsule by mouth daily. Max Daily Amount: 1 capsule 30 capsule 0      Objective   Vitals:   05/23/22 1315  BP: 103/72  Patient Position: Sitting  Pulse: 89  Temp: 98 F (36.7 C)  TempSrc: Tympanic  Weight: 192 lb (87.1 kg)  SpO2: 100%  PainSc:   8  PainLoc: Arm Comment: LEFT Arm    Physical Exam Constitutional:      General: She is not in acute distress.    Appearance: She is well-developed. She is not diaphoretic.  Eyes:     General:        Right eye: No discharge.        Left eye: No discharge.     Conjunctiva/sclera: Conjunctivae normal.  Cardiovascular:     Rate and Rhythm: Normal rate and regular rhythm.     Heart sounds: Normal heart sounds. No murmur heard.    No friction rub. No gallop.  Musculoskeletal:     Right lower leg: No edema.     Left lower leg: No edema.     Comments: Very mild discrepancy (0.5 cm) in the right upper arm compared to the left in diameter. No swelling in the wrist or hand compared to the left. Patient notes tenderness to palpation in the medial and lateral epicondyles, and then diffusely over the soft tissue.  No significant tenderness to palpation over the ulnar styloid or over the wrist.  Patient reports significant pressure in the antecubital region with constant pressure in the wrist.  No pain with flexion extension in the wrist.  Patient does note pain with supination and pronation in the wrist.  She does have decreased strength on the right upper extremity compared to the left.  Brisk capillary refill in the fingers.  Pulmonary:     Effort: No respiratory  distress.     Breath sounds: Normal breath sounds. No wheezing or rales.  Chest:     Chest wall: No tenderness.  Skin:    General: Skin is warm and dry.  Neurological:     Mental Status: She is alert and oriented to person, place, and time.      Camelia CANDIE Kidney, MD 05/23/2022, 5:01 PM *Some images could not be shown.

## 2022-05-26 ENCOUNTER — Encounter: Payer: Self-pay | Admitting: Obstetrics & Gynecology

## 2022-05-26 ENCOUNTER — Other Ambulatory Visit (HOSPITAL_COMMUNITY)
Admission: RE | Admit: 2022-05-26 | Discharge: 2022-05-26 | Disposition: A | Discharge status: Home / Self Care | Payer: BC Managed Care – PPO | Source: Ambulatory Visit | Attending: Obstetrics & Gynecology | Admitting: Obstetrics & Gynecology

## 2022-05-26 ENCOUNTER — Ambulatory Visit (INDEPENDENT_AMBULATORY_CARE_PROVIDER_SITE_OTHER): Payer: BC Managed Care – PPO | Admitting: Obstetrics & Gynecology

## 2022-05-26 VITALS — Resp 16 | Ht 67.0 in | Wt 191.0 lb

## 2022-05-26 DIAGNOSIS — Z01419 Encounter for gynecological examination (general) (routine) without abnormal findings: Secondary | ICD-10-CM | POA: Insufficient documentation

## 2022-05-26 DIAGNOSIS — N911 Secondary amenorrhea: Secondary | ICD-10-CM

## 2022-05-26 NOTE — Progress Notes (Signed)
Subjective:     Carol Montoya is a 42 y.o. female here for a routine exam.  Current complaints: random spotting since IUD removal.  No real menses.  Has been on birth control since 42 years old so does not know hwat her cycle is without it.  Used clomid to become pregnant.  Denies female pattern heair growth, rapid weight gain, heat or cold intolerance   Gynecologic History No LMP recorded. (Menstrual status: IUD). Contraception: vasectomy Last Mammogram: 5/23 Last Pap Smear:  01/10/19- negative Last Colon Screening;  never Seat Belts:   yes Sun Screen:   yes Dental Check Up:  yes Brush & Floss:  yes   Obstetric History OB History  Gravida Para Term Preterm AB Living  3 3 3     3   SAB IAB Ectopic Multiple Live Births               # Outcome Date GA Lbr Len/2nd Weight Sex Delivery Anes PTL Lv  3 Term 01/10/10    M Vag-Spont     2 Term 02/19/06    M Vag-Spont     1 Term 11/25/01    M Vag-Spont        The following portions of the patient's history were reviewed and updated as appropriate: allergies, current medications, past family history, past medical history, past social history, past surgical history, and problem list.  Review of Systems Pertinent items noted in HPI and remainder of comprehensive ROS otherwise negative.    Objective:     Vitals:   05/26/22 0909  Resp: 16  Weight: 191 lb (86.6 kg)  Height: 5\' 7"  (1.702 m)   Vitals:  WNL General appearance: alert, cooperative and no distress  HEENT: Normocephalic, without obvious abnormality, atraumatic Eyes: negative Throat: lips, mucosa, and tongue normal; teeth and gums normal  Respiratory: Clear to auscultation bilaterally  CV: Regular rate and rhythm  Breasts:  Normal appearance, no masses or tenderness, no nipple retraction or dimpling  GI: Soft, non-tender; bowel sounds normal; no masses,  no organomegaly  GU: External Genitalia:  Tanner V, no lesion Urethra:  No prolapse   Vagina: Pink, normal rugae,  no blood or discharge; well estrogenized  Cervix: No CMT, no lesion  Uterus:  Normal size and contour, non tender  Adnexa: Normal, no masses, non tender  Musculoskeletal: No edema, redness or tenderness in the calves or thighs  Skin: No lesions or rash  Lymphatic: Axillary adenopathy: none     Psychiatric: Normal mood and behavior        Assessment:    Healthy female exam.    Plan:   Pap with co testing Breat surveillance by [facility] Colon cancer screening next year Secondary amenorrhea--Will check FSH, E2, TSH, 17 OHP.  Will base next steps on results which could include [place], provera withdrawal bleed and endometrial biopsy.

## 2022-05-28 LAB — CYTOLOGY - PAP
Comment: NEGATIVE
Diagnosis: NEGATIVE
High risk HPV: NEGATIVE

## 2022-06-06 LAB — TSH: TSH: 2.38 u[IU]/mL (ref 0.450–4.500)

## 2022-06-06 LAB — 17-HYDROXYPROGESTERONE: 17-OH Progesterone LCMS: 33 ng/dL

## 2022-06-06 LAB — FOLLICLE STIMULATING HORMONE: FSH: 15.5 m[IU]/mL

## 2022-06-06 LAB — ESTRADIOL: Estradiol: 44.3 pg/mL

## 2022-06-12 ENCOUNTER — Encounter: Payer: Self-pay | Admitting: Obstetrics & Gynecology

## 2022-06-16 ENCOUNTER — Other Ambulatory Visit: Payer: Self-pay | Admitting: Obstetrics & Gynecology

## 2022-06-16 DIAGNOSIS — N911 Secondary amenorrhea: Secondary | ICD-10-CM

## 2022-06-16 NOTE — Progress Notes (Signed)
Secondary amenorrhea (some spotting since removal of IUD in October 2023

## 2022-07-01 ENCOUNTER — Ambulatory Visit (INDEPENDENT_AMBULATORY_CARE_PROVIDER_SITE_OTHER): Payer: BC Managed Care – PPO

## 2022-07-01 DIAGNOSIS — N911 Secondary amenorrhea: Secondary | ICD-10-CM | POA: Diagnosis not present

## 2022-08-14 ENCOUNTER — Encounter: Payer: Self-pay | Admitting: Obstetrics & Gynecology

## 2023-02-17 NOTE — Progress Notes (Signed)
 Assessment and Plan   1. Atypical chest pain  ECG 12 lead   D-dimer   Metanephrines, Pheochromocyt Random Urine   Catecholamines, Fractionated, Plasma   Catecholamines, Fractionated, Plasma   D-dimer   Metanephrines, Pheochromocyt Random Urine   Ambulatory referral to Cardiology   CANCELED: Metanephrines, Pheochromocyt Random Urine    2. Palpitations  D-dimer   Metanephrines, Pheochromocyt Random Urine   Catecholamines, Fractionated, Plasma   Catecholamines, Fractionated, Plasma   D-dimer   Metanephrines, Pheochromocyt Random Urine   Ambulatory referral to Cardiology   CANCELED: Metanephrines, Pheochromocyt Random Urine    3. Diaphoresis  Ambulatory referral to Cardiology      Patient is presenting with about 3 weeks of intermittent feelings of palpitations with a normal heart rate, intermittent diaphoresis, and slightly increased blood pressure from her baseline.  She has not noticed any triggers to this.  Exam is reassuring.  EKG today is stable.  A D-dimer was obtained to rule out PE and is negative/normal.  I have ordered catecholamines to evaluate for possible pheochromocytoma.  She also has atypical chest discomfort.  Chest pain is reproducible on palpation over the left anterior chest wall.    A referral to cardiology was made given ongoing symptoms.   Risks, benefits, and alternatives of the medications and treatment plan prescribed today were discussed, and patient expressed understanding. Plan follow-up as discussed or as needed if any worsening symptoms or change in condition.  Patient voiced understanding of the treatment plan and agreed to attempt to comply.  No follow-ups on file.     Patient's Medications       * Accurate as of February 17, 2023  5:14 PM. Reflects encounter med changes as of last refresh          Continued Medications      Instructions  amphetamine-dextroamphetamine 30 MG 24 hr capsule Commonly known as: ADDERALL XR  Takes as  need daily for inattention   EPINEPHrine  0.3 mg/0.3 mL injection Commonly known as: AUVI-Q ,EPIPEN   0.3 mg, Intramuscular, Once as needed   levonorgestrel -ethinyl estradiol  0.15-0.03 MG per tablet Commonly known as: SEASONALE,QUASENSE,JOLESSA  1 tablet, Oral, Daily   metoprolol succinate 25 mg 24 hr tablet Commonly known as: TOPROL XL  12.5 mg, Oral, Daily   Tirzepatide-Weight Management 15 MG/0.5ML Soaj          This note was dictated with voice recognition software. Similar sounding words can inadvertently be transcribed and may not be corrected upon review.       Camelia GORMAN Kidney, MD 02/17/2023, 5:14 PM   Subjective   Patient ID:  Carol Montoya is a 42 y.o. (DOB July 23, 1980) female who presents with:     Patient presents with  . Intermittent chest pain     Chief complaint comments reviewed and discussed with patient in detail.  HPI   The patient is presenting with an ongoing feeling like she has a pulse of adrenaline.  This has been going on now for 3 weeks.  She reports it feels like her heart is racing, and at times she is diaphoretic, but when she is checked her heart with her stethoscope, it does not seem fast.  She feels like that currently today as we got an EKG which is normal sinus rhythm.  Blood pressures actually gotten higher during this episodes as well, up to a systolic of 135 when her baseline is typically 105.  Initially, these episodes would start midday and progressively worsen throughout  the night but she did wake up and they were gone.  Starting today, that feeling was present on going to bed and waking up. She has a wave of jerking that starts at the top of her body and goes down to the leg. It feels like an electrical shock.  About 3 days ago she had a sharp chest pain while making dinner.  This pain is present all the time, but worsened with walking, palpation, and at times deep inspiration.  It is left-sided.  He does not have any shortness of  breath.  Over the last 2 weeks, she has had mild constipation.  She has not had any abdominal pain with her episodes above.  She has not had any nausea or vomiting.  She has not had any excessive salivation, in fact she has noted dry mouth when these episodes occur.  She has noted a new headache at times behind the left eye which occurs during these spells.  Not noticed any tremor.  In the past, she has had panic attacks, but has had more shortness of breath and other symptoms with it-she has not had this.  She has a prescription for Ativan, but has not tried it for these episodes.  Can't recall last time she took Adderall.  She's eating and drinking normally. Has been on Zepbound 15mg  weekly since June.   Allergies  Allergen Reactions  . Bee Venom Anaphylaxis  . Shellfish Allergy Swelling    Outpatient Medications Marked as Taking for the 02/17/23 encounter (Office Visit) with Camelia GORMAN Kidney, MD  Medication Sig Dispense Refill  . amphetamine-dextroamphetamine (ADDERALL XR) 30 MG 24 hr capsule Takes as need daily for inattention 30 capsule 0  . EPINEPHrine  (AUVI-Q ,EPIPEN ) 0.3 mg/0.3 mL injection Inject 0.3 mLs (0.3 mg dose) into the muscle once as needed for Anaphylaxis.    . levonorgestrel -ethinyl estradiol  (SEASONALE,QUASENSE,JOLESSA) 0.15-0.03 MG per tablet Take one tablet by mouth daily.    . metoprolol succinate (TOPROL XL) 25 mg 24 hr tablet Take one half tablet (12.5 mg dose) by mouth daily. 30 tablet 5  . Tirzepatide-Weight Management 15 MG/0.5ML SOAJ         Objective   Vitals:   02/17/23 1348  BP: 104/72  Pulse: 87  Temp: 98.3 F (36.8 C)  TempSrc: Oral  Weight: 162 lb (73.5 kg)  SpO2: 100%  PainSc:   4    Physical Exam Constitutional:      General: She is not in acute distress.    Appearance: She is well-developed. She is not diaphoretic.  Eyes:     General:        Right eye: No discharge.        Left eye: No discharge.     Conjunctiva/sclera: Conjunctivae  normal.  Neck:     Comments: No thyromegaly Cardiovascular:     Rate and Rhythm: Normal rate and regular rhythm.     Heart sounds: Normal heart sounds. No murmur heard.    No friction rub. No gallop.  Musculoskeletal:     Right lower leg: No edema.     Left lower leg: No edema.  Pulmonary:     Effort: No respiratory distress.     Breath sounds: Normal breath sounds. No wheezing or rales.  Chest:     Chest wall: No tenderness.  Abdominal:     General: Abdomen is flat. Bowel sounds are normal. There is no distension.     Palpations: Abdomen is soft.  Skin:  General: Skin is warm and dry.  Neurological:     Mental Status: She is alert and oriented to person, place, and time.       *Some images could not be shown.

## 2023-06-01 ENCOUNTER — Telehealth: Payer: Self-pay | Admitting: *Deleted

## 2023-06-01 ENCOUNTER — Ambulatory Visit: Payer: BC Managed Care – PPO | Admitting: Obstetrics & Gynecology

## 2023-06-01 ENCOUNTER — Encounter: Payer: Self-pay | Admitting: Obstetrics & Gynecology

## 2023-06-01 NOTE — Telephone Encounter (Signed)
 Left patient a message with rescheduled appointment information per patient request.

## 2023-06-15 ENCOUNTER — Ambulatory Visit: Payer: BC Managed Care – PPO | Admitting: Obstetrics & Gynecology

## 2023-06-15 ENCOUNTER — Encounter: Payer: Self-pay | Admitting: Obstetrics & Gynecology

## 2023-06-15 VITALS — BP 114/78 | HR 68 | Wt 193.0 lb

## 2023-06-15 DIAGNOSIS — Z01419 Encounter for gynecological examination (general) (routine) without abnormal findings: Secondary | ICD-10-CM

## 2023-06-15 NOTE — Progress Notes (Signed)
 Patient presents for Annual.  LMP: [place] Last pap: Date: 05/26/22 Contraception:  [place]  Mammogram: Up to date: 02/20/23 STD Screening: Declines Flu Vaccine :  02/17/23  CC:  Denies any concerns     Subjective:     Carol Montoya is a 43 y.o. female here for a routine exam.  Current complaints: none--on continuous OCPs--no bleeding.     Gynecologic History No LMP recorded. (Menstrual status: IUD). Contraception: OCP (estrogen/progesterone) LMP: [place] Last pap: Date: 05/26/22 Contraception: [place]  Mammogram: Up to date: 02/20/23--alternates MRI and mammogram q 6 months for family history. STD Screening: Declines Flu Vaccine : 02/17/23  Obstetric History OB History  Gravida Para Term Preterm AB Living  3 3 3   3   SAB IAB Ectopic Multiple Live Births          # Outcome Date GA Lbr Len/2nd Weight Sex Type Anes PTL Lv  3 Term 01/10/10    M Vag-Spont     2 Term 02/19/06    M Vag-Spont     1 Term 11/25/01    M Vag-Spont        The following portions of the patient's history were reviewed and updated as appropriate: allergies, current medications, past family history, past medical history, past social history, past surgical history, and problem list.  Review of Systems Pertinent items noted in HPI and remainder of comprehensive ROS otherwise negative.    Objective:   Vitals:   06/15/23 1121  BP: 114/78  Pulse: 68  Weight: 193 lb (87.5 kg)    Vitals:  WNL General appearance: alert, cooperative and no distress  HEENT: Normocephalic, without obvious abnormality, atraumatic Eyes: negative Throat: lips, mucosa, and tongue normal; teeth and gums normal  Respiratory: Clear to auscultation bilaterally  CV: Regular rate and rhythm  Breasts:  Normal appearance, no masses or tenderness, no nipple retraction or dimpling--s/p augmentation surgery  GI: Soft, non-tender; bowel sounds normal; no masses,  no organomegaly  GU: External Genitalia:  Tanner V, no  lesion Urethra:  No prolapse   Vagina: Pink, normal rugae, no blood or discharge  Cervix: No CMT, no lesion  Uterus:  Normal size and contour, non tender  Adnexa: Normal, no masses, non tender  Musculoskeletal: No edema, redness or tenderness in the calves or thighs  Skin: No lesions or rash  Lymphatic: Axillary adenopathy: none     Psychiatric: Normal mood and behavior         Assessment:    Healthy female exam.    Plan:    1.  Pap up to  date 2.  Mammogram/MRI q 6 months as above.   3.  Colonoscopy due 4.  Continuous OCPs

## 2023-10-06 NOTE — Progress Notes (Signed)
 Discharge:    Patient evaluated in recovery and deemed stable following procedure and sedation. Patient being discharged per physician's orders.  All questions and concerns have been addressed.

## 2023-10-21 NOTE — Progress Notes (Addendum)
 Assessment and Plan   1. Fatigue, unspecified type  Testosterone, Free, Total   Estradiol    FSH   Vitamin D  25 Hydroxy   Vitamin B12   Vitamin B12   Vitamin D  25 Hydroxy   FSH   Estradiol    Testosterone, Free, Total    2. Hyperlipidemia, unspecified hyperlipidemia type  LP+Non-HDL Cholesterol   LP+Non-HDL Cholesterol    3. Atypical chest pain      4. GAD (generalized anxiety disorder)      5. Recurrent major depressive disorder, in full remission      6. Family history of early CAD      7. Obesity (BMI 30-39.9)  Vitamin D  25 Hydroxy   Vitamin B12   Vitamin B12   Vitamin D  25 Hydroxy    8. Peripheral polyneuropathy  Vitamin D  25 Hydroxy   Vitamin B12   Vitamin B12   Vitamin D  25 Hydroxy      Patient presenting for ongoing fatigue.  She feels she is sleeping okay.  Believes a lot of this may be related to anxiety.  Unfortunately, anxiety continues to be a fairly big issue but she has failed most oral options.  She inquires about different hormonal options.  Discussed that there are no standard levels for things like testosterone in women.  Discussed while it is not FDA approved, there are some practices like integrative medicine that will compound creams and/or pellets.  I discouraged her from ever doing something like pellets given it is not FDA cleared and difficult to reverse, especially given her family history of breast cancer.  Blood work today just to get a baseline.  Will also check B12 level and vitamin D  level.  She is off cholesterol medication.  She has follow-up with cardiology in 2 weeks.  If cholesterol level is high, will discuss with him.  She will continue to follow with psychiatry and her therapist.   Risks, benefits, and alternatives of the medications and treatment plan prescribed today were discussed, and patient expressed understanding. Plan follow-up as discussed or as needed if any worsening symptoms or change in condition.  Patient voiced  understanding of the treatment plan and agreed to attempt to comply.  No follow-ups on file.     Patient's Medications       * Accurate as of October 21, 2023 11:59 PM. Reflects encounter med changes as of last refresh          Continued Medications      Instructions  EPINEPHrine  0.3 mg/0.3 mL injection Commonly known as: AUVI-Q ,EPIPEN   0.3 mg, Once as needed   levonorgestrel -ethinyl estradiol  0.15-0.03 MG per tablet Commonly known as: SEASONALE,QUASENSE,JOLESSA  1 tablet, Daily         This note was dictated with voice recognition software. Similar sounding words can inadvertently be transcribed and may not be corrected upon review.       Camelia GORMAN Kidney, MD 11/02/2023, 4:47 PM   Subjective   Patient ID:  Carol Montoya is a 43 y.o. (DOB 08-28-1980) female who presents with:     Patient presents with  . Hyperlipidemia  . ADHD     Chief complaint comments reviewed and discussed with patient in detail.  HPI   Migraine-saw neurology.    Hyperlipidemia / left sided chest pain / family history of early CAD-  saw Dr. Pascal with cardiology. Her insurance would not cover coronary CT angiogram, wanted her to get a stress test first, which  was going to be over thousand dollars.  Stopped cholesterol medication.  She has follow-up with him in 2 weeks.  Her lipoprotein a level was normal.  ADHD- tried on Azstarys at the end of May.  Meds tried- Intolerant to short acting medications in the past such as Ritalin, Strattera- ineffective   Depression /anxiety- on sublinguinal ketamine but only helps while doing the treatment (~1 hour). Sees therapy.   Past treatments include benzodiazephines, counseling (CBT), lifestyle changes, non-benzodiazephine anxiolytics, non-SSRI antidepressants and SSRIs (The patient has tried Prozac, BuSpar , Klonopin, Ativan, Lexapro, Effexor, Celexa, Vraylar, Trintellix and Wellbutrin. Amitriptyline 25mg - ineffective.  Seroquel- weight gain /  night sweats, melatonin- ineffective. No caffeine intake, Lunesta- ineffective and side effects   Obesity-currently on compounded ozempic 1mg  weekly  She has been an ongoing issue for her for years.  It seems to be getting progressively worse.  Sleep is not great related to anxiety.  She tries to stay active, but it is difficult.  Bowel movements are pretty good.  Denies any heat or cold intolerance.  Allergies  Allergen Reactions  . Bee Venom Anaphylaxis  . Shellfish Allergy Swelling    Outpatient Medications Marked as Taking for the 10/21/23 encounter (Office Visit) with Camelia Adrien Kidney, MD  Medication Sig Dispense Refill  . EPINEPHrine  (AUVI-Q ,EPIPEN ) 0.3 mg/0.3 mL injection Inject 0.3 mLs (0.3 mg dose) into the muscle once as needed for Anaphylaxis.    . levonorgestrel -ethinyl estradiol  (SEASONALE,QUASENSE,JOLESSA) 0.15-0.03 MG per tablet Take one tablet by mouth daily.        Objective   Vitals:   10/21/23 1041  BP: 106/74  Patient Position: Sitting  Pulse: 82  Temp: 98.2 F (36.8 C)  Weight: 158 lb 12.8 oz (72 kg)  SpO2: 99%  PainSc: 0-No pain    Physical Exam Constitutional:      General: She is not in acute distress.    Appearance: She is well-developed. She is not diaphoretic.   Eyes:     General:        Right eye: No discharge.        Left eye: No discharge.     Conjunctiva/sclera: Conjunctivae normal.    Cardiovascular:     Rate and Rhythm: Normal rate and regular rhythm.     Heart sounds: Normal heart sounds. No murmur heard.    No friction rub. No gallop.   Musculoskeletal:     Right lower leg: No edema.     Left lower leg: No edema.  Pulmonary:     Effort: No respiratory distress.     Breath sounds: Normal breath sounds. No wheezing or rales.  Chest:     Chest wall: No tenderness.  Abdominal:     General: Abdomen is flat. Bowel sounds are normal. There is no distension.     Palpations: Abdomen is soft.   Skin:    General: Skin is warm  and dry.  Neurological:     Mental Status: She is alert and oriented to person, place, and time.     *Some images could not be shown.

## 2023-11-03 NOTE — Progress Notes (Signed)
 Cardiology Office Note  Primary Care Physician: No Pcp   Problem List[1]  Chief Complaint  Patient presents with  . Follow-up    Allergy:Venom-honey bee and Shellfish derived  Current Medications[2]   HPI AND PAST HISTORY:  HPI: [name] is a 43 year old woman who has a history of dyslipidemia, family history of premature atherosclerotic heart disease and recent episode of severe chest heaviness.  She tells me that 1 evening when she was watching TV with her husband in October she developed a significant chest tightness with some radiation towards her left shoulder and neck.  She could also feel her heart skipping and went upstairs and checked her blood pressure; it was elevated for her with a systolic of 137.  Things eventually calm down in the next day she could noticed there was still a chest discomfort but without any associated nausea, vomiting, diaphoresis or shortness of breath.  Her sister who is just 5 years older recently had an acute coronary syndrome and required intracoronary stent placement.  When I saw [name] 6 months ago we ordered a CCTA but this was not approved by her insurance company.  [name]'s last lipid profile was abnormal.  She is now taking Crestor 5 mg daily. She is here now for further cardiac evaluation.  Medical History[3]  1. Palpitations   2. Dyslipidemia   3. Family history of premature CAD     Surgical History[4]  Social History[5]  Family History[6]  ROS:   Complete review of systems performed with positive/negative pertinent findings listed in HPI. All other systems are negative.  PHYSICAL EXAMINATION:   BP 104/74 (BP Location: Right arm, Patient Position: Sitting)   Pulse 86   Wt 70.3 kg (155 lb)   SpO2 99%   GENERAL: The patient appears well and is in no distress; is in good spirits. Body habitus is normal. NECK:  Jugular venous distention: none (< 8 cm) and the thyroid is not enlarged.  CHEST: The lungs are clear to  auscultation.  The respiratory effort is normal.  HEART: The pulse is regular. The heart is not enlarged by percussion. Heart sounds: normal.  No gallops are present. No rubs are present. Murmurs: None. VASCULAR: Carotid upstrokes are normal and no bruit noted. The abdominal aorta is not enlarged.  Femoral pulses are present and normal. Pedal pulses are present and normal. ABDOMEN: There is no hepatomegaly; masses or bruits. No tenderness noted.  MUSCULOSKELETAL: muscle strength and tone are normal; no chest wall tenderness or deformity; no clubbing or cyanosis EXTREMITIES: No phlebitis; edema not present . SKIN: There is no pallor, jaundice, ulcers or xanthoma and the skin is warm and dry HEMATOLOGIC/LYMPHATIC: No petechiae or bruising; no lymphadenopathy detected NEUROLOGIC: Patient is alert and oriented ; strength and sensation appear normal; no focal deficits are noted. PSYCHIATRIC: mood and affect are appropriate  DATA REVIEW:   ECG: not done  IMPRESSION/PLAN:   1. Palpitations      2. Dyslipidemia      3. Family history of premature CAD        Plan I have talked to the patient and with her husband went about her recurrent symptoms and she will be scheduled for an exercise nuclear stress test.  I do not feel a stress echo would be adequate since she has breast implants and this could cause some acoustic shadowing. 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 I encouraged her to continue her outstanding Mediterranean heart healthy diet as well as her exercise program. Final treatment recommendations will be made after the nuclear stress test is completed and reviewed.  I will tentatively see her back in 6 to 12 months.  Risks, benefits, and alternatives of the medications and treatment plan prescribed today were discussed, and patient expressed understanding.  Plan follow-up as discussed or as needed if any worsening symptoms or change in condition.  Health Maintenance issues including appropriate healthy diet, exercise and smoking avoidance were discussed with pt.  No follow-ups on file.   Medication List       * Accurate as of November 03, 2023 11:40 AM. If you have any questions, ask your nurse or doctor.          CONTINUE taking these medications    rosuvastatin 5 mg tablet Commonly known as: Crestor Take 1 tablet (5 mg total) by mouth daily.             [1] Patient Active Problem List Diagnosis  . Palpitations  . Left chest pressure  . Family history of premature CAD  . Dyslipidemia  [2] Current Outpatient Medications  Medication Sig Dispense Refill  . rosuvastatin (Crestor) 5 mg tablet Take 1 tablet (5 mg total) by mouth daily. 90 tablet 3   No current facility-administered medications for this visit.  [3] No past medical history on file. [4] No past surgical history on file. [5] Social History Socioeconomic History  . Marital status: Married  Tobacco Use  . Smoking status: Never  . Smokeless tobacco: Never   Social Drivers of Health   Food Insecurity: No Food Insecurity (05/20/2022)   Received from [facility]   Food vital sign   . Within the past 12 months, you worried that your food would run out before you got money to buy more: Never true   . Within the past 12 months, the food you bought just didn't last and you didn't have money to get more: Never true  Transportation Needs: No Transportation Needs (05/20/2022)   Received from [facility] - Transportation   . Lack of Transportation (Medical): No   . Lack of Transportation (Non-Medical): No  Safety: Low Risk  (04/28/2023)   Safety   . How often does anyone, including family and friends, physically hurt you?: Never   . How often does anyone, including family and friends, insult or talk down to you?: Never   . How often does anyone,  including family and friends, threaten you with harm?: Never   . How often does anyone, including family and friends, scream or curse at you?: Never  Living Situation: Low Risk  (05/20/2022)   Received from [facility] Stability Vital Sign   . Unable to Pay for Housing in the Last Year: No   . Number of Places Lived in the Last Year: 1   . In the last 12 months, was there a time when you did not have a steady place to sleep or slept in a shelter (including now)?: No  [6] No family history on file. *Some images could not be shown.

## 2023-11-10 ENCOUNTER — Encounter: Payer: Self-pay | Admitting: Obstetrics & Gynecology

## 2023-11-11 NOTE — Telephone Encounter (Signed)
 LVM for a return call to reschedule their canceled appt with C. Reed in [facility]

## 2024-02-01 ENCOUNTER — Encounter: Payer: Self-pay | Admitting: Obstetrics & Gynecology

## 2024-02-01 ENCOUNTER — Ambulatory Visit: Admitting: Obstetrics & Gynecology

## 2024-02-01 ENCOUNTER — Other Ambulatory Visit (HOSPITAL_COMMUNITY)
Admission: RE | Admit: 2024-02-01 | Discharge: 2024-02-01 | Disposition: A | Discharge status: Home / Self Care | Source: Ambulatory Visit | Attending: Obstetrics & Gynecology | Admitting: Obstetrics & Gynecology

## 2024-02-01 VITALS — BP 101/70 | HR 66 | Ht 66.0 in | Wt 155.0 lb

## 2024-02-01 DIAGNOSIS — R102 Pelvic and perineal pain unspecified side: Secondary | ICD-10-CM | POA: Diagnosis present

## 2024-02-01 DIAGNOSIS — Z113 Encounter for screening for infections with a predominantly sexual mode of transmission: Secondary | ICD-10-CM | POA: Insufficient documentation

## 2024-02-01 DIAGNOSIS — N911 Secondary amenorrhea: Secondary | ICD-10-CM | POA: Diagnosis not present

## 2024-02-01 DIAGNOSIS — N926 Irregular menstruation, unspecified: Secondary | ICD-10-CM

## 2024-02-01 LAB — POCT URINE PREGNANCY: Preg Test, Ur: NEGATIVE

## 2024-02-01 LAB — POCT URINALYSIS DIPSTICK
Bilirubin, UA: NEGATIVE
Glucose, UA: NEGATIVE
Ketones, UA: NEGATIVE
Leukocytes, UA: NEGATIVE
Nitrite, UA: NEGATIVE
Protein, UA: POSITIVE — AB
Spec Grav, UA: >=1.03 — AB (ref 1.010–1.025)
Urobilinogen, UA: 0.2 U/dL
pH, UA: 5.5 (ref 5.0–8.0)

## 2024-02-01 NOTE — Progress Notes (Signed)
   Subjective:    Patient ID: Carol Montoya, female    DOB: 1980/05/29, 43 y.o.   MRN: [id number]  HPI  43 yo G3P3 with two weeks of sharp stabbing pains anywhere from 2-6/10 pain.  During recent intercourse there was pain that it felt like things were being pushed around. No discharge, N/V/D, dysuria.  Pt stopped birth control pills end of May; husband had vasectomy.  She has been on OCPs for most of life until IUD.  Did not have problems getting pregnant.  Only a few days of spotting since stopped OCPs  Review of Systems  Constitutional: Negative.   Respiratory: Negative.    Cardiovascular: Negative.   Gastrointestinal: Negative.  Negative for constipation, diarrhea, nausea and vomiting.       Bloating  Genitourinary:  Positive for dyspareunia, menstrual problem and pelvic pain. Negative for dysuria, frequency, vaginal bleeding and vaginal discharge.       Objective:   Physical Exam Vitals reviewed.  Constitutional:      General: She is not in acute distress.    Appearance: She is well-developed.  HENT:     Head: Normocephalic and atraumatic.  Eyes:     Conjunctiva/sclera: Conjunctivae normal.  Cardiovascular:     Rate and Rhythm: Normal rate.  Pulmonary:     Effort: Pulmonary effort is normal.  Abdominal:     General: Abdomen is flat.     Palpations: Abdomen is soft.  Genitourinary:    Comments: Tanner V Vulva:  No lesion Vagina:  Pink, no lesions, small amt of white discharge, no blood Cervix:  + pain with moving cerivx Uterus:  +moderately tender, mobile Right adnexa--non tender, no mass Left adnexa--+ mild to moderately tender, no mass Skin:    General: Skin is warm and dry.  Neurological:     Mental Status: She is alert and oriented to person, place, and time.  Psychiatric:        Mood and Affect: Mood normal.    Vitals:   02/01/24 1311  BP: 101/70  Pulse: 66  Weight: 155 lb (70.3 kg)  Height: 5' 6 (1.676 m)       Assessment & Plan:  43 yo  female with   Secondary amenorrhea after stopping OCPs in May--Check FSH, estradiol , prolactin, TSH Midline pelvic pain--UA show blood-->will send culture; UPT negative, GC/Chlamydia sent; pelvic US  complete with TVUS If all above are negative--could be endometriosis as pt has been on OCPs for a long time; can restart to see if this helps.   I provided 30 minutes of verbal and non-verbal time during this encounter date, time was needed to gather information, review chart, records, communicate/coordinate with staff, as well as complete documentation.

## 2024-02-02 ENCOUNTER — Ambulatory Visit: Payer: Self-pay | Admitting: Obstetrics & Gynecology

## 2024-02-02 LAB — CERVICOVAGINAL ANCILLARY ONLY
Bacterial Vaginitis (gardnerella): NEGATIVE
Candida Glabrata: NEGATIVE
Candida Vaginitis: NEGATIVE
Chlamydia: NEGATIVE
Comment: NEGATIVE
Comment: NEGATIVE
Comment: NEGATIVE
Comment: NEGATIVE
Comment: NEGATIVE
Comment: NORMAL
Neisseria Gonorrhea: NEGATIVE
Trichomonas: NEGATIVE

## 2024-02-03 LAB — URINE CULTURE

## 2024-02-04 ENCOUNTER — Ambulatory Visit (INDEPENDENT_AMBULATORY_CARE_PROVIDER_SITE_OTHER)

## 2024-02-04 ENCOUNTER — Other Ambulatory Visit: Payer: Self-pay | Admitting: Obstetrics & Gynecology

## 2024-02-04 DIAGNOSIS — N926 Irregular menstruation, unspecified: Secondary | ICD-10-CM

## 2024-02-04 DIAGNOSIS — R102 Pelvic and perineal pain unspecified side: Secondary | ICD-10-CM | POA: Diagnosis not present

## 2024-02-04 LAB — PROLACTIN: Prolactin: 13.6 ng/mL (ref 4.8–33.4)

## 2024-02-04 LAB — ESTRADIOL: Estradiol: 61.2 pg/mL

## 2024-02-04 LAB — FOLLICLE STIMULATING HORMONE: FSH: 4 m[IU]/mL

## 2024-02-04 LAB — TSH: TSH: 1.98 u[IU]/mL (ref 0.450–4.500)

## 2024-02-04 MED ORDER — MEDROXYPROGESTERONE ACETATE 10 MG PO TABS: 10.0000 mg | ORAL_TABLET | Freq: Every day | ORAL | 2 refills | Status: AC

## 2024-02-04 NOTE — Progress Notes (Signed)
 No menses since may and off OCPs.   Provera x10 days Will discuss long term treatment at next visit.

## 2024-02-19 ENCOUNTER — Telehealth: Payer: Self-pay | Admitting: *Deleted

## 2024-02-19 NOTE — Telephone Encounter (Signed)
 Left patient a message to call and schedule F/U appointment with Dr. Cris.
